# Patient Record
Sex: Female | Born: 1985 | Race: White | Hispanic: No | Marital: Single | Smoking: Never smoker
Health system: Southern US, Community
[De-identification: ages and names within clinical notes are randomized; demographics above are authoritative.]

## PROBLEM LIST (undated history)

## (undated) ENCOUNTER — Inpatient Hospital Stay (HOSPITAL_COMMUNITY): Payer: Self-pay

## (undated) DIAGNOSIS — F419 Anxiety disorder, unspecified: Secondary | ICD-10-CM

## (undated) DIAGNOSIS — F32A Depression, unspecified: Secondary | ICD-10-CM

## (undated) DIAGNOSIS — F329 Major depressive disorder, single episode, unspecified: Secondary | ICD-10-CM

## (undated) DIAGNOSIS — F909 Attention-deficit hyperactivity disorder, unspecified type: Secondary | ICD-10-CM

## (undated) DIAGNOSIS — O139 Gestational [pregnancy-induced] hypertension without significant proteinuria, unspecified trimester: Secondary | ICD-10-CM

## (undated) DIAGNOSIS — J45909 Unspecified asthma, uncomplicated: Secondary | ICD-10-CM

## (undated) DIAGNOSIS — J302 Other seasonal allergic rhinitis: Secondary | ICD-10-CM

## (undated) HISTORY — DX: Attention-deficit hyperactivity disorder, unspecified type: F90.9

## (undated) HISTORY — DX: Other seasonal allergic rhinitis: J30.2

## (undated) HISTORY — DX: Anxiety disorder, unspecified: F41.9

## (undated) HISTORY — DX: Depression, unspecified: F32.A

## (undated) HISTORY — PX: NO PAST SURGERIES: SHX2092

## (undated) HISTORY — DX: Unspecified asthma, uncomplicated: J45.909

## (undated) HISTORY — DX: Major depressive disorder, single episode, unspecified: F32.9

---

## 2010-07-06 ENCOUNTER — Encounter: Payer: Self-pay | Admitting: Family Medicine

## 2010-07-06 NOTE — Progress Notes (Signed)
Subjective:    Patient ID: Melissa Young, female    DOB: 03-12-1986, 25 y.o.   MRN: [id number]  Sinusitis This is a new problem. The current episode started 1 to 4 weeks ago. The problem is unchanged. There has been no fever. The pain is mild. Associated symptoms include congestion, coughing, ear pain, sinus pressure and sneezing. Pertinent negatives include no chills, diaphoresis, headaches, hoarse voice, neck pain, shortness of breath, sore throat or swollen glands. Treatments tried: zyrtec 10 mg daily. The treatment provided no relief.   Past Medical History  Diagnosis Date  . Seasonal allergies    No past surgical history on file. History   Social History  . Marital Status: N/A    Spouse Name: N/A    Number of Children: N/A  . Years of Education: N/A   Occupational History  . Not on file.   Social History Main Topics  . Smoking status: Never Smoker   . Smokeless tobacco: Not on file  . Alcohol Use: No  . Drug Use: No  . Sexually Active: Not on file   Other Topics Concern  . Not on file   Social History Narrative  . No narrative on file   No family history on file. No current outpatient prescriptions on file prior to visit.   No Known Allergies Immunization History  Administered Date(s) Administered  . Td 04/03/1999   Prior to Admission medications   Medication Sig Start Date End Date Taking? Authorizing Provider  amoxicillin (AMOXIL) 875 MG tablet Take 875 mg by mouth 2 (two) times daily.   07/06/10 07/16/10 Yes Historical Provider, MD  cetirizine (ZYRTEC) 10 MG tablet Take 10 mg by mouth daily.     Yes Historical Provider, MD  drospirenone-ethinyl estradiol (YASMIN) 3-0.03 MG per tablet Take 1 tablet by mouth daily.     Yes Historical Provider, MD  fluticasone (FLONASE) 50 MCG/ACT nasal spray 2 sprays by Nasal route daily. #1 RFx2  07/06/10  Yes Historical Provider, MD  @   Review of Systems  Constitutional: Negative.  Negative for chills and diaphoresis.  HENT:  Positive for ear pain, congestion, rhinorrhea, sneezing, postnasal drip and sinus pressure. Negative for hearing loss, nosebleeds, sore throat, hoarse voice, facial swelling, neck pain, neck stiffness, tinnitus and ear discharge.   Eyes: Negative.   Respiratory: Positive for cough. Negative for apnea, choking, chest tightness, shortness of breath, wheezing and stridor.   Cardiovascular: Negative.   Gastrointestinal: Negative.   Musculoskeletal: Negative.   Neurological: Negative.  Negative for headaches.  Hematological: Negative.   Psychiatric/Behavioral: Negative.      BP 106/70  Pulse 66  Temp 98.9 F (37.2 C)  Resp 14  Wt 210 lb (95.255 kg)    Objective:   Physical Exam  Constitutional: She is oriented to person, place, and time. She appears well-developed and well-nourished. No distress.  HENT:  Head: Normocephalic and atraumatic.  Right Ear: Hearing, external ear and ear canal normal. Tympanic membrane is injected.  Left Ear: Hearing, tympanic membrane, external ear and ear canal normal.  Nose: Mucosal edema and rhinorrhea present. No nose lacerations, sinus tenderness, nasal deformity, septal deviation or nasal septal hematoma. No epistaxis.  No foreign bodies. Right sinus exhibits maxillary sinus tenderness. Right sinus exhibits no frontal sinus tenderness. Left sinus exhibits no maxillary sinus tenderness and no frontal sinus tenderness.  Mouth/Throat: Uvula is midline, oropharynx is clear and moist and mucous membranes are normal.  Eyes: Conjunctivae and EOM are normal. Pupils are equal,  round, and reactive to light.  Neck: Normal range of motion. Neck supple.  Cardiovascular: Normal rate, regular rhythm, normal heart sounds and intact distal pulses.   No murmur heard. Pulmonary/Chest: Effort normal and breath sounds normal.  Abdominal: Soft. Bowel sounds are normal.  Musculoskeletal: Normal range of motion.  Neurological: She is alert and oriented to person, place, and  time. She has normal reflexes.  Skin: Skin is warm and dry. She is not diaphoretic.  Psychiatric: She has a normal mood and affect. Her behavior is normal. Judgment and thought content normal.          Assessment & Plan:  1. Seasonal allergic rhinitis 2.Right Maxillary sinusitis 3.Otalgia, right  Plan: Stay on the zyrtec Added Flonase as above.#1 RFx2, 2 sprays per nostril daily. Amoxil 875 mg po twice daily x 10 days. Fluids Allergen avoidance.  RTC prn and for her physical as p[lanned in a couple of months.

## 2011-04-09 ENCOUNTER — Encounter: Payer: Self-pay | Admitting: Obstetrics & Gynecology

## 2011-04-09 ENCOUNTER — Ambulatory Visit (INDEPENDENT_AMBULATORY_CARE_PROVIDER_SITE_OTHER): Payer: Private Health Insurance - Indemnity | Admitting: Obstetrics & Gynecology

## 2011-04-09 VITALS — BP 137/80 | HR 67 | Ht 64.0 in | Wt 238.4 lb

## 2011-04-09 DIAGNOSIS — Z309 Encounter for contraceptive management, unspecified: Secondary | ICD-10-CM

## 2011-04-09 DIAGNOSIS — Z30017 Encounter for initial prescription of implantable subdermal contraceptive: Secondary | ICD-10-CM

## 2011-04-09 NOTE — Progress Notes (Signed)
  Subjective:    Patient ID: Melissa Young, female    DOB: 1986/02/17, 26 y.o.   MRN: [id number]  HPI  She is here because she finds the OCPs difficult to remember. Several of her friends have Implanon and are happy with it. She wants to have it in spite of my cautions about irregular bleeding. She tells me that she used depo provera in the past so she is familiar with irregular bleeding. Review of Systems    Her last pap was in June and was normal Objective:   Physical Exam  I prepped her left arm about 8 cm about the medial epicondyle. It was then infiltrated with 3 cc of 1% lidocaine. I placed the Implanon close to the skin and wrapped her arm with compression tape.      Assessment & Plan:  Birth control- Implanon now in place. RTC for annual in June.

## 2011-05-07 ENCOUNTER — Ambulatory Visit (INDEPENDENT_AMBULATORY_CARE_PROVIDER_SITE_OTHER): Payer: Private Health Insurance - Indemnity | Admitting: Obstetrics & Gynecology

## 2011-05-07 ENCOUNTER — Encounter: Payer: Self-pay | Admitting: Obstetrics & Gynecology

## 2011-05-07 VITALS — BP 138/89 | HR 89 | Ht 65.0 in | Wt 240.0 lb

## 2011-05-07 DIAGNOSIS — Z3040 Encounter for surveillance of contraceptives, unspecified: Secondary | ICD-10-CM

## 2011-05-07 NOTE — Progress Notes (Signed)
History:  26 y.o. here for Implanon check.  No concerns.  No  Irregular bleeding.  The following portions of the patient's history were reviewed and updated as appropriate: allergies, current medications, past family history, past medical history, past social history, past surgical history and problem list.   Objective:  Physical Exam Blood pressure 138/89, pulse 89, height 5\' 5"  (1.651 m), weight 240 lb (108.863 kg), last menstrual period 03/12/2011. Gen: NAD Left arm: Implanon palpated.  No erythema, tenderness or other concerns.   Assessment & Plan:  Stable Implanon check Counseled regarding possible side-effects Return to clinic for annual exam in the summer

## 2011-05-07 NOTE — Patient Instructions (Signed)

## 2011-09-18 ENCOUNTER — Encounter: Payer: Self-pay | Admitting: Obstetrics and Gynecology

## 2011-09-18 ENCOUNTER — Ambulatory Visit (INDEPENDENT_AMBULATORY_CARE_PROVIDER_SITE_OTHER): Payer: Private Health Insurance - Indemnity | Admitting: Obstetrics and Gynecology

## 2011-09-18 VITALS — BP 122/87 | HR 82 | Ht 65.0 in | Wt 252.0 lb

## 2011-09-18 DIAGNOSIS — Z3046 Encounter for surveillance of implantable subdermal contraceptive: Secondary | ICD-10-CM

## 2011-09-18 MED ORDER — NORETHIN-ETH ESTRAD-FE BIPHAS 1 MG-10 MCG / 10 MCG PO TABS: 1.0000 | ORAL_TABLET | Freq: Every day | ORAL | Status: DC

## 2011-09-18 NOTE — Progress Notes (Signed)
26 yo presenting today requesting Implanon removal. Patient had Implanon placed in 04/2011 and reports dissatisfaction with irregular bleeding and worsening acne. Patient planning on using OCP for birth control. Rx for lo loestrin provided  Implanon removal Patient given informed consent for removal of her Implanon, time out was performed.  Signed copy in the chart.  Appropriate time out taken. Implanon site identified.  Area prepped in usual sterile fashon. One cc of 1% lidocaine was used to anesthetize the area at the distal end of the implant. A small stab incision was made right beside the implant on the distal portion.  The implanon rod was grasped using hemostats and removed without difficulty.  There was less than 3 cc blood loss. There were no complications.  A small amount of antibiotic ointment and steri-strips were applied over the small incision.  A pressure bandage was applied to reduce any bruising.  The patient tolerated the procedure well and was given post procedure instructions.

## 2012-10-09 LAB — HM PAP SMEAR: HM Pap smear: NORMAL

## 2013-11-17 LAB — HEMOGLOBIN A1C: HEMOGLOBIN A1C: 5.4 % (ref 4.0–6.0)

## 2014-10-11 ENCOUNTER — Encounter: Payer: Self-pay | Admitting: Physician Assistant

## 2014-10-11 ENCOUNTER — Ambulatory Visit (INDEPENDENT_AMBULATORY_CARE_PROVIDER_SITE_OTHER): Payer: BLUE CROSS/BLUE SHIELD | Admitting: Physician Assistant

## 2014-10-11 VITALS — BP 122/80 | HR 78 | Temp 98.4°F | Resp 14 | Ht 65.0 in | Wt 257.0 lb

## 2014-10-11 DIAGNOSIS — N912 Amenorrhea, unspecified: Secondary | ICD-10-CM

## 2014-10-11 DIAGNOSIS — Z139 Encounter for screening, unspecified: Secondary | ICD-10-CM

## 2014-10-11 LAB — PREGNANCY, URINE: PREG TEST UR: NEGATIVE

## 2014-10-11 NOTE — Progress Notes (Signed)
Patient ID: Melissa Young MRN: [id number], DOB: 08/10/1985, 29 y.o. Date of Encounter: 10/11/2014, 5:16 PM    Chief Complaint:  Chief Complaint  Patient presents with  . irregular Menses    [facility]- has not had regular menses- has had negative home pregnancy tests- would like blood test     HPI: 29 y.o. year old white female presents with above.   Says [place]  [place] [facility] [place] then she will return here to have CPE.   Says she was on OCT until March.  Says she isn't "trying" to get pregnant, but says she would be ok with getting pregnant. Says this is why OCT was stopped.   After she stopped OCT, had some menstrual bleeding in beginning of April.  In May saw "light spotting" once--when urinated saw pink one time.  Has had no bleeding, spotting at all since then.   Has done multiple pregnancy tests at home--all negative.   Wasn't sure if needed serum pregnancy test.       Home Meds:   Outpatient Prescriptions Prior to Visit  Medication Sig Dispense Refill  . cetirizine (ZYRTEC) 10 MG tablet Take 10 mg by mouth daily.      . fluticasone (FLONASE) 50 MCG/ACT nasal spray 2 sprays by Nasal route daily. #1 RFx2     . drospirenone-ethinyl estradiol (YASMIN) 3-0.03 MG per tablet Take 1 tablet by mouth daily.      . Norethindrone-Ethinyl Estradiol-Fe Biphas (LO LOESTRIN FE) 1 MG-10 MCG / 10 MCG tablet Take 1 tablet by mouth daily. 1 Package 11  . norgestimate-ethinyl estradiol (ORTHO-CYCLEN,SPRINTEC,PREVIFEM) 0.25-35 MG-MCG tablet Take 1 tablet by mouth daily.      . sertraline (ZOLOFT) 50 MG tablet Take 50 mg by mouth daily.       No facility-administered medications prior to visit.    Allergies: No Known Allergies    Review of Systems: See HPI for pertinent ROS. All other ROS negative.    Physical Exam: Blood pressure 122/80, pulse 78, temperature 98.4 F (36.9 C),  temperature source Oral, resp. rate 14, height 5\' 5"  (1.651 m), weight 257 lb (116.574 kg), last menstrual period 07/05/2014., Body mass index is 42.77 kg/(m^2). General:  Obese WF. Appears in no acute distress. Neck: Supple. No thyromegaly. No lymphadenopathy. Lungs: Clear bilaterally to auscultation without wheezes, rales, or rhonchi. Breathing is unlabored. Heart: Regular rhythm. No murmurs, rubs, or gallops. Msk:  Strength and tone normal for age. Extremities/Skin: Warm and dry.  Neuro: Alert and oriented X 3. Moves all extremities spontaneously. Gait is normal. CNII-XII grossly in tact. Psych:  Responds to questions appropriately with a normal affect.   Results for orders placed or performed in visit on 10/11/14  Pregnancy, urine  Result Value Ref Range   Preg Test, Ur NEG      ASSESSMENT AND PLAN:  29 y.o. year old female with  1. Amenorrhea I explained serum pregnancy tests vs urine pregnancy tests.  Pt then saw no reason to do serum pregnancy test.  Discussed doing some lab work, eval for amenorrhea.  She says will schedule another OV to f/u [place].  In interim discussed getting plenty of sleep, good diet, and avoid stressors. (She brings up stress related to move etc.)  2. Screening - Pregnancy, urine    Signed, [address] [place], [place], [facility] 10/11/2014 5:16 PM

## 2014-10-21 ENCOUNTER — Other Ambulatory Visit: Payer: BLUE CROSS/BLUE SHIELD

## 2014-10-21 DIAGNOSIS — N912 Amenorrhea, unspecified: Secondary | ICD-10-CM

## 2014-10-21 DIAGNOSIS — Z0000 Encounter for general adult medical examination without abnormal findings: Secondary | ICD-10-CM

## 2014-10-21 LAB — CBC WITH DIFFERENTIAL/PLATELET
BASOS PCT: 0 % (ref 0–1)
Basophils Absolute: 0 10*3/uL (ref 0.0–0.1)
Eosinophils Absolute: 0.4 10*3/uL (ref 0.0–0.7)
Eosinophils Relative: 7 % — ABNORMAL HIGH (ref 0–5)
HEMATOCRIT: 43.1 % (ref 36.0–46.0)
HEMOGLOBIN: 14.3 g/dL (ref 12.0–15.0)
LYMPHS PCT: 38 % (ref 12–46)
Lymphs Abs: 2.2 10*3/uL (ref 0.7–4.0)
MCH: 30.3 pg (ref 26.0–34.0)
MCHC: 33.2 g/dL (ref 30.0–36.0)
MCV: 91.3 fL (ref 78.0–100.0)
MPV: 9.8 fL (ref 8.6–12.4)
Monocytes Absolute: 0.7 10*3/uL (ref 0.1–1.0)
Monocytes Relative: 12 % (ref 3–12)
NEUTROS PCT: 43 % (ref 43–77)
Neutro Abs: 2.5 10*3/uL (ref 1.7–7.7)
Platelets: 332 10*3/uL (ref 150–400)
RBC: 4.72 MIL/uL (ref 3.87–5.11)
RDW: 13.3 % (ref 11.5–15.5)
WBC: 5.8 10*3/uL (ref 4.0–10.5)

## 2014-10-21 LAB — LIPID PANEL
CHOL/HDL RATIO: 3.7 ratio
Cholesterol: 190 mg/dL (ref 0–200)
HDL: 52 mg/dL (ref >=46)
LDL CALC: 120 mg/dL — AB (ref 0–99)
TRIGLYCERIDES: 88 mg/dL (ref <150)
VLDL: 18 mg/dL (ref 0–40)

## 2014-10-21 LAB — COMPLETE METABOLIC PANEL WITH GFR
ALBUMIN: 3.8 g/dL (ref 3.5–5.2)
ALK PHOS: 72 U/L (ref 39–117)
ALT: 24 U/L (ref 0–35)
AST: 27 U/L (ref 0–37)
BILIRUBIN TOTAL: 0.6 mg/dL (ref 0.2–1.2)
BUN: 12 mg/dL (ref 6–23)
CO2: 22 mEq/L (ref 19–32)
CREATININE: 0.65 mg/dL (ref 0.50–1.10)
Calcium: 9.4 mg/dL (ref 8.4–10.5)
Chloride: 103 mEq/L (ref 96–112)
GFR, Est African American: >89 mL/min
GFR, Est Non African American: >89 mL/min
GLUCOSE: 77 mg/dL (ref 70–99)
Potassium: 4.2 mEq/L (ref 3.5–5.3)
SODIUM: 138 meq/L (ref 135–145)
TOTAL PROTEIN: 6.9 g/dL (ref 6.0–8.3)

## 2014-10-21 LAB — TSH: TSH: 3.788 u[IU]/mL (ref 0.350–4.500)

## 2014-10-22 LAB — HCG, QUANTITATIVE, PREGNANCY

## 2014-10-27 ENCOUNTER — Encounter: Payer: Self-pay | Admitting: Physician Assistant

## 2014-10-27 ENCOUNTER — Ambulatory Visit (INDEPENDENT_AMBULATORY_CARE_PROVIDER_SITE_OTHER): Payer: BLUE CROSS/BLUE SHIELD | Admitting: Physician Assistant

## 2014-10-27 VITALS — BP 122/78 | HR 68 | Temp 98.0°F | Resp 18 | Wt 258.0 lb

## 2014-10-27 DIAGNOSIS — N912 Amenorrhea, unspecified: Secondary | ICD-10-CM

## 2014-10-27 DIAGNOSIS — Z0000 Encounter for general adult medical examination without abnormal findings: Secondary | ICD-10-CM | POA: Diagnosis not present

## 2014-10-27 DIAGNOSIS — E669 Obesity, unspecified: Secondary | ICD-10-CM | POA: Diagnosis not present

## 2014-10-27 DIAGNOSIS — J302 Other seasonal allergic rhinitis: Secondary | ICD-10-CM | POA: Insufficient documentation

## 2014-10-27 NOTE — Progress Notes (Signed)
Patient ID: Melissa Young MRN: [id number], DOB: [birth year], 29 y.o. Date of Encounter: 10/27/2014,   Chief Complaint: Physical (CPE)  HPI: 29 y.o. y/o white female  here for CPE.   She states that she had been on oral contraception therapy for > 10 years. Went off of them in March. Has not had a regular menses since then. Recently came in and had pregnancy test which was negative.  She recently moved here from [place]. Says multiple family members come here to this office.  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 Says in the last couple days she started to get a little bit of a scratchy throat and feels some congestion in her chest. Thinks it's just a cold.   Says that her prior doctor prescribed Vyvanse for what he called Binge Eating Disorder.  Says that she recently had stopped the Vyvanse and went on vacation and has gained about 20 pounds.  She has seasonal allergies and during those times uses Zyrtec and Flonase.  She reports that she has no other known past medical history. Has never had surgeries. Has no significant family medical history.  Review of Systems: Consitutional: No fever, chills, fatigue, night sweats, lymphadenopathy. No significant/unexplained weight changes. Eyes: No visual changes, eye redness, or discharge. ENT/Mouth: No ear pain,  nasal drainage, or sinus pain. Cardiovascular: No chest pressure,heaviness, tightness or squeezing, even with exertion. No increased shortness of breath or dyspnea on exertion.No palpitations, edema, orthopnea, PND. Respiratory: No cough, hemoptysis, SOB, or wheezing. Gastrointestinal: No anorexia, dysphagia, reflux, pain, nausea, vomiting, hematemesis, diarrhea, constipation, BRBPR, or melena. Breast: No mass, nodules, bulging, or retraction. No skin changes or inflammation. No nipple discharge. No lymphadenopathy. Genitourinary: No dysuria, hematuria, incontinence, vaginal discharge, pruritis,  burning, abnormal bleeding, or pain. Musculoskeletal: No decreased ROM, No joint pain or swelling. No significant pain in neck, back, or extremities. Skin: No rash, pruritis, or concerning lesions. Neurological: No headache, dizziness, syncope, seizures, tremors, memory loss, coordination problems, or paresthesias. Psychological: No anxiety, depression, hallucinations, SI/HI. Endocrine: No polydipsia, polyphagia, polyuria, or known diabetes.No increased fatigue. No palpitations/rapid heart rate. No significant/unexplained weight change. All other systems were reviewed and are otherwise negative.  Past Medical History  Diagnosis Date  . Seasonal allergies   . Anxiety   . Depression   . Morbid obesity      No past surgical history on file.  Home Meds:  Outpatient Prescriptions Prior to Visit  Medication Sig Dispense Refill  . cetirizine (ZYRTEC) 10 MG tablet Take 10 mg by mouth daily.      . fluticasone (FLONASE) 50 MCG/ACT nasal spray 2 sprays by Nasal route daily. #1 RFx2      No facility-administered medications prior to visit.    Allergies: No Known Allergies  History   Social History  . Marital Status: Single    Spouse Name: N/A  . Number of Children: N/A  . Years of Education: N/A   Occupational History  . Not on file.   Social History Main Topics  . Smoking status: Never Smoker   . Smokeless tobacco: Not on file  . Alcohol Use: Yes     Comment: occasionally  . Drug Use: No  . Sexual Activity: Yes    Birth Control/ Protection: Pill   Other Topics Concern  . Not on file   Social History Narrative    Family History  Problem Relation Age of Onset  . Alcohol abuse Maternal Grandmother  Physical Exam: Blood pressure 122/78, pulse 68, temperature 98 F (36.7 C), temperature source Oral, resp. rate 18, weight 258 lb (117.028 kg), last menstrual period 07/05/2014., Body mass index is 42.93 kg/(m^2). General: Obese WF. Appears in no acute distress. HEENT:  Normocephalic, atraumatic. Conjunctiva pink, sclera non-icteric. Pupils 2 mm constricting to 1 mm, round, regular, and equally reactive to light and accomodation. EOMI. Internal auditory canal clear. TMs with good cone of light and without pathology. Nasal mucosa pink. Nares are without discharge. No sinus tenderness. Oral mucosa pink.  Pharynx without exudate.   Neck: Supple. Trachea midline. No thyromegaly. Full ROM. No lymphadenopathy.No Carotid Bruits. Lungs: Clear to auscultation bilaterally without wheezes, rales, or rhonchi. Breathing is of normal effort and unlabored. Cardiovascular: RRR with S1 S2. No murmurs, rubs, or gallops. Distal pulses 2+ symmetrically. No carotid or abdominal bruits. Breast: Symmetrical. No masses. Nipples without discharge. Abdomen: Soft, non-tender, non-distended with normoactive bowel sounds. No hepatosplenomegaly or masses. No rebound/guarding. No CVA tenderness. No hernias.  Genitourinary:  External genitalia without lesions. Vaginal mucosa pink.No discharge present. Cervix pink and without discharge. No cervical tenderness.Normal uterus size. No adnexal mass or tenderness.  Pap smear taken. Musculoskeletal: Full range of motion and 5/5 strength throughout. Without swelling, atrophy, tenderness, crepitus, or warmth. Skin: Warm and moist without erythema, ecchymosis, wounds, or rash. Neuro: A+Ox3. CN II-XII grossly intact. Moves all extremities spontaneously. Full sensation throughout. Normal gait. DTR 2+ throughout upper and lower extremities.  Psych:  Responds to questions appropriately with a normal affect.   Assessment/Plan:  29 y.o. y/o female here for CPE 1. Visit for preventive health examination  A. Screening Labs: She had fasting labs 10/21/14. CME T normal TSH normal FLP normal CBC normal  B. Pap: Pelvic exam normal today. Will send Pap smear. - PAP, ThinPrep ASCUS Rflx HPV Rflx Type  C. Screening Mammogram: No indication to start this prior  to age 66  D. DEXA/BMD:  Will wait until closer to age 3 to start this  E. Colorectal Cancer Screening: No indication to require this until age 29  F. Immunizations:  Influenza:---------N/A--July Tetanus:--------- she reports that she had this with her provider in [facility] for 5 years ago. Records pending. Pneumococcal:--- No indication to need this until age 2 Zostavax:------------ not indicated until age 54      2. Amenorrhea following discontinuation of oral contraceptive use - Ambulatory referral to Gynecology Will refer to gynecology for further evaluation.   3. Obesity - Ambulatory referral to Gynecology Given her obesity, amenorrhea--could possibly have PCOS--Reger to Gyn for eval.  Her prior provider prescribed Vyvanse for binge eating disorder. I do not prescribe this medication for this indication. Patient did not ask for prescription for this today.  4. Seasonal allergies She uses Zyrtec and Flonase when needed and this controls symptoms.  5. Viral URI If symptoms worsen significantly or persist greater than 7 days call me.  Murray Hodgkins [place], [place], [facility] 10/27/2014 8:32 AM

## 2014-10-28 ENCOUNTER — Telehealth: Payer: Self-pay | Admitting: Family Medicine

## 2014-10-28 MED ORDER — AZITHROMYCIN 250 MG PO TABS: ORAL_TABLET | ORAL | Status: DC

## 2014-10-28 NOTE — Telephone Encounter (Signed)
Pt made aware of provider recommendations and rx to pharmacy

## 2014-10-28 NOTE — Telephone Encounter (Signed)
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  Said she had scratchy throat for couple days.  Tell her that most of these illnesses are caused by viruses, which means they will run their course and resolve wihtout treatment---but usually persists about 7 days.  Tell her if it is a virus, antibiotics will do nothing.  And, in that case, she is building up antibiotic resistnce.  Tell her to only take antibiotic if symptoms worsen a lot or last > 7 days.  If she starts abx, complete all of it.  Azithromycin   Day 1: Take 2 daily.  Days 2-5: Take 1 daily.  # 6 (one pack) + 0.

## 2014-10-28 NOTE — Telephone Encounter (Signed)
Pt seen yesterday.  Says her cough is much worse, some chills, chest very tight and painful.  Can you call something in for her?

## 2014-10-29 LAB — PAP THINPREP ASCUS RFLX HPV RFLX TYPE

## 2014-11-01 ENCOUNTER — Encounter: Payer: Self-pay | Admitting: Family Medicine

## 2014-11-01 ENCOUNTER — Encounter: Payer: Self-pay | Admitting: Physician Assistant

## 2014-11-03 MED ORDER — FLUCONAZOLE 150 MG PO TABS: 150.0000 mg | ORAL_TABLET | Freq: Once | ORAL | Status: DC

## 2014-11-09 ENCOUNTER — Ambulatory Visit (INDEPENDENT_AMBULATORY_CARE_PROVIDER_SITE_OTHER): Payer: BLUE CROSS/BLUE SHIELD | Admitting: Women's Health

## 2014-11-09 ENCOUNTER — Encounter: Payer: Self-pay | Admitting: Women's Health

## 2014-11-09 VITALS — BP 128/80 | Ht 65.0 in | Wt 250.0 lb

## 2014-11-09 DIAGNOSIS — N912 Amenorrhea, unspecified: Secondary | ICD-10-CM | POA: Diagnosis not present

## 2014-11-09 NOTE — Progress Notes (Signed)
Melissa Young [birth year] [id number]    History:    Presents for new patient with a problem of irregular cycles/amenorrhea. Stopped OCs February 2016 and has not had a cycle since with numerous negative UPTs. Had been on OCs for about 10 years. Prior to OCs had monthly cycles. Increased facial/chin hair growth, acne and weight gain last 6 months. Binge eating disorder had been on Vyvanse prior to moving here from [place]. Normal Pap this year, TSH 3.7. Completed gardasil. Same partner with negative STD screen. No menopausal symptoms. Annual exam with primary care June 2016.  Past medical history, past surgical history, family history and social history were all reviewed and documented in the EPIC chart. 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  ROS:  A ROS was performed and pertinent positives and negatives are included.  Exam:  Filed Vitals:   11/09/14 1502  BP: 128/80    General appearance: Obese  Thyroid:  Symmetrical, normal in size, without palpable masses or nodularity. Respiratory  Auscultation:  Clear without wheezing or rhonchi Cardiovascular  Auscultation:  Regular rate, without rubs, murmurs or gallops  Edema/varicosities:  Not grossly evident Abdominal  Soft,nontender, without masses, guarding or rebound.  Liver/spleen:  No organomegaly noted  Hernia:  None appreciated  Skin  Inspection:  Grossly normal   Breasts: Examined lying and sitting.     Right: Without masses, retractions, discharge or axillary adenopathy.     Left: Without masses, retractions, discharge or axillary adenopathy. Gentitourinary   Inguinal/mons:  Normal without inguinal adenopathy  External genitalia:  Normal  BUS/Urethra/Skene's glands:  Normal  Vagina:  Normal  Cervix:  Normal  Uterus:  normal in size, shape and contour.  Midline and mobile  Adnexa/parametria:     Rt: Without masses or tenderness.   Lt: Without masses or  tenderness.  Anus and perineum: Normal  Digital rectal exam: Normal sphincter tone without palpated masses or tenderness  Assessment/Plan:  28 y.o.SWF G0  for problem/amenorrhea.  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 Not desiring conception at this time, after ultrasound reviewed starting back on OCs until desiring pregnancy. Reviewed importance of decreasing calories and increasing exercise for weight loss, Weight Watchers encouraged. Encouraged to return to primary care for binge eating disorder.      Harrington Challenger [facility], 4:32 PM 11/09/2014

## 2014-11-09 NOTE — Patient Instructions (Signed)
Polycystic Ovarian Syndrome  Polycystic ovarian syndrome (PCOS) is a common hormonal disorder among women of reproductive age. Most women with PCOS grow many small cysts on their ovaries. PCOS can cause problems with your periods and make it difficult to get pregnant. It can also cause an increased risk of miscarriage with pregnancy. If left untreated, PCOS can lead to serious health problems, such as diabetes and heart disease.  CAUSES  The cause of PCOS is not fully understood, but genetics may be a factor.  SIGNS AND SYMPTOMS    Infrequent or no menstrual periods.    Inability to get pregnant (infertility) because of not ovulating.    Increased growth of hair on the face, chest, stomach, back, thumbs, thighs, or toes.    Acne, oily skin, or dandruff.    Pelvic pain.    Weight gain or obesity, usually carrying extra weight around the waist.    Type 2 diabetes.    High cholesterol.    High blood pressure.    Female-pattern baldness or thinning hair.    Patches of thickened and dark brown or black skin on the neck, arms, breasts, or thighs.    Tiny excess flaps of skin (skin tags) in the armpits or neck area.    Excessive snoring and having breathing stop at times while asleep (sleep apnea).    Deepening of the voice.    Gestational diabetes when pregnant.   DIAGNOSIS   There is no single test to diagnose PCOS.    Your health care provider will:    Take a medical history.    Perform a pelvic exam.    Have ultrasonography done.    Check your female and female hormone levels.    Measure glucose or sugar levels in the blood.    Do other blood tests.    If you are producing too many female hormones, your health care provider will make sure it is from PCOS. At the physical exam, your health care provider will want to evaluate the areas of increased hair growth. Try to allow natural hair growth for a few days before the visit.    During a pelvic exam, the ovaries may be  enlarged or swollen because of the increased number of small cysts. This can be seen more easily by using vaginal ultrasonography or screening to examine the ovaries and lining of the uterus (endometrium) for cysts. The uterine lining may become thicker if you have not been having a regular period.   TREATMENT   Because there is no cure for PCOS, it needs to be managed to prevent problems. Treatments are based on your symptoms. Treatment is also based on whether you want to have a baby or whether you need contraception.   Treatment may include:    Progesterone hormone to start a menstrual period.    Birth control pills to make you have regular menstrual periods.    Medicines to make you ovulate, if you want to get pregnant.    Medicines to control your insulin.    Medicine to control your blood pressure.    Medicine and diet to control your high cholesterol and triglycerides in your blood.   Medicine to reduce excessive hair growth.   Surgery, making small holes in the ovary, to decrease the amount of female hormone production. This is done through a long, lighted tube (laparoscope) placed into the pelvis through a tiny incision in the lower abdomen.   HOME CARE INSTRUCTIONS   Only   take over-the-counter or prescription medicine as directed by your health care provider.   Pay attention to the foods you eat and your activity levels. This can help reduce the effects of PCOS.   Keep your weight under control.   Eat foods that are low in carbohydrate and high in fiber.   Exercise regularly.  SEEK MEDICAL CARE IF:   Your symptoms do not get better with medicine.   You have new symptoms.  Document Released: 07/13/2004 Document Revised: 01/07/2013 Document Reviewed: 09/04/2012  [employer] Patient Information 2015 [employer]. This information is not intended to replace advice given to you by your health care provider. Make sure you discuss any questions you have with your health care provider.

## 2014-11-10 ENCOUNTER — Other Ambulatory Visit: Payer: Self-pay | Admitting: Women's Health

## 2014-11-10 ENCOUNTER — Encounter: Payer: Self-pay | Admitting: Physician Assistant

## 2014-11-10 ENCOUNTER — Encounter: Payer: Self-pay | Admitting: Women's Health

## 2014-11-10 ENCOUNTER — Encounter: Payer: Self-pay | Admitting: Family Medicine

## 2014-11-10 DIAGNOSIS — B029 Zoster without complications: Secondary | ICD-10-CM | POA: Insufficient documentation

## 2014-11-10 DIAGNOSIS — E782 Mixed hyperlipidemia: Secondary | ICD-10-CM

## 2014-11-10 DIAGNOSIS — F909 Attention-deficit hyperactivity disorder, unspecified type: Secondary | ICD-10-CM

## 2014-11-10 DIAGNOSIS — F5081 Binge eating disorder: Secondary | ICD-10-CM | POA: Insufficient documentation

## 2014-11-10 DIAGNOSIS — F50819 Binge eating disorder, unspecified: Secondary | ICD-10-CM | POA: Insufficient documentation

## 2014-11-10 DIAGNOSIS — K219 Gastro-esophageal reflux disease without esophagitis: Secondary | ICD-10-CM | POA: Insufficient documentation

## 2014-11-10 DIAGNOSIS — R768 Other specified abnormal immunological findings in serum: Secondary | ICD-10-CM

## 2014-11-10 LAB — PROLACTIN: PROLACTIN: 24.4 ng/mL

## 2014-11-10 LAB — TESTOSTERONE, FREE, TOTAL, SHBG
Sex Hormone Binding: 47 nmol/L (ref 17–124)
TESTOSTERONE: 122 ng/dL — AB (ref 10–70)
Testosterone, Free: 18 pg/mL — ABNORMAL HIGH (ref 0.6–6.8)
Testosterone-% Free: 1.5 % (ref 0.4–2.4)

## 2014-11-10 MED ORDER — DROSPIRENONE-ETHINYL ESTRADIOL 3-0.02 MG PO TABS: 1.0000 | ORAL_TABLET | Freq: Every day | ORAL | Status: DC

## 2014-11-19 ENCOUNTER — Ambulatory Visit (INDEPENDENT_AMBULATORY_CARE_PROVIDER_SITE_OTHER): Payer: BLUE CROSS/BLUE SHIELD | Admitting: Women's Health

## 2014-11-19 ENCOUNTER — Encounter: Payer: Self-pay | Admitting: Women's Health

## 2014-11-19 ENCOUNTER — Ambulatory Visit (INDEPENDENT_AMBULATORY_CARE_PROVIDER_SITE_OTHER): Payer: BLUE CROSS/BLUE SHIELD

## 2014-11-19 ENCOUNTER — Other Ambulatory Visit: Payer: Self-pay | Admitting: Women's Health

## 2014-11-19 DIAGNOSIS — E282 Polycystic ovarian syndrome: Secondary | ICD-10-CM | POA: Diagnosis not present

## 2014-11-19 DIAGNOSIS — N8320 Unspecified ovarian cysts: Secondary | ICD-10-CM

## 2014-11-19 DIAGNOSIS — N912 Amenorrhea, unspecified: Secondary | ICD-10-CM

## 2014-11-19 DIAGNOSIS — N83201 Unspecified ovarian cyst, right side: Secondary | ICD-10-CM

## 2014-11-19 NOTE — Progress Notes (Signed)
Patient ID: Melissa Young, female   DOB: Oct 13, 1985, 29 y.o.   MRN: [id number] Presents for ultrasound. At annual exam suspicious for possible PCO S.  Has had periods of amenorrhea when not on contraception. Had been on Yaz but stopped and had amenorrhea. Has had problems with weight gain, mild hirsutism and did not conceive when off OCs. Same partner.  Exam: Obese. Ultrasound: T/V anteverted uterus homogeneous echo pattern. Endometrium 6.3 mm. Right and left ovary increased volume greater than 10 mL. Numerous follicles permenter of ovaries greater than 10. Positive arterial blood flow bilateral ovaries. Right ovary thin-walled cyst 20 x 12 x 14 mm with solid anterior focus 4 x 2 x 3 mm questionable dermoid plug, negative CFD. Negative cul-de-sac .  PCO S  Plan: Reviewed ultrasound findings. Repeat ultrasound in 3 months to check cyst with questionable dermoid plug. Continue Yaz call if no withdrawal bleed after completing 2 months. Reviewed importance of regular exercise, cutting  calories for weight loss.

## 2014-11-19 NOTE — Patient Instructions (Signed)
Polycystic Ovarian Syndrome  Polycystic ovarian syndrome (PCOS) is a common hormonal disorder among women of reproductive age. Most women with PCOS grow many small cysts on their ovaries. PCOS can cause problems with your periods and make it difficult to get pregnant. It can also cause an increased risk of miscarriage with pregnancy. If left untreated, PCOS can lead to serious health problems, such as diabetes and heart disease.  CAUSES  The cause of PCOS is not fully understood, but genetics may be a factor.  SIGNS AND SYMPTOMS    Infrequent or no menstrual periods.    Inability to get pregnant (infertility) because of not ovulating.    Increased growth of hair on the face, chest, stomach, back, thumbs, thighs, or toes.    Acne, oily skin, or dandruff.    Pelvic pain.    Weight gain or obesity, usually carrying extra weight around the waist.    Type 2 diabetes.    High cholesterol.    High blood pressure.    Female-pattern baldness or thinning hair.    Patches of thickened and dark brown or black skin on the neck, arms, breasts, or thighs.    Tiny excess flaps of skin (skin tags) in the armpits or neck area.    Excessive snoring and having breathing stop at times while asleep (sleep apnea).    Deepening of the voice.    Gestational diabetes when pregnant.   DIAGNOSIS   There is no single test to diagnose PCOS.    Your health care provider will:    Take a medical history.    Perform a pelvic exam.    Have ultrasonography done.    Check your female and female hormone levels.    Measure glucose or sugar levels in the blood.    Do other blood tests.    If you are producing too many female hormones, your health care provider will make sure it is from PCOS. At the physical exam, your health care provider will want to evaluate the areas of increased hair growth. Try to allow natural hair growth for a few days before the visit.    During a pelvic exam, the ovaries may be  enlarged or swollen because of the increased number of small cysts. This can be seen more easily by using vaginal ultrasonography or screening to examine the ovaries and lining of the uterus (endometrium) for cysts. The uterine lining may become thicker if you have not been having a regular period.   TREATMENT   Because there is no cure for PCOS, it needs to be managed to prevent problems. Treatments are based on your symptoms. Treatment is also based on whether you want to have a baby or whether you need contraception.   Treatment may include:    Progesterone hormone to start a menstrual period.    Birth control pills to make you have regular menstrual periods.    Medicines to make you ovulate, if you want to get pregnant.    Medicines to control your insulin.    Medicine to control your blood pressure.    Medicine and diet to control your high cholesterol and triglycerides in your blood.   Medicine to reduce excessive hair growth.   Surgery, making small holes in the ovary, to decrease the amount of female hormone production. This is done through a long, lighted tube (laparoscope) placed into the pelvis through a tiny incision in the lower abdomen.   HOME CARE INSTRUCTIONS   Only   take over-the-counter or prescription medicine as directed by your health care provider.   Pay attention to the foods you eat and your activity levels. This can help reduce the effects of PCOS.   Keep your weight under control.   Eat foods that are low in carbohydrate and high in fiber.   Exercise regularly.  SEEK MEDICAL CARE IF:   Your symptoms do not get better with medicine.   You have new symptoms.  Document Released: 07/13/2004 Document Revised: 01/07/2013 Document Reviewed: 09/04/2012  [employer] Patient Information 2015 [employer]. This information is not intended to replace advice given to you by your health care provider. Make sure you discuss any questions you have with your health care provider.

## 2014-12-01 ENCOUNTER — Other Ambulatory Visit: Payer: BLUE CROSS/BLUE SHIELD

## 2014-12-01 ENCOUNTER — Ambulatory Visit: Payer: BLUE CROSS/BLUE SHIELD | Admitting: Women's Health

## 2014-12-09 ENCOUNTER — Encounter: Payer: Self-pay | Admitting: Physician Assistant

## 2014-12-09 ENCOUNTER — Ambulatory Visit (INDEPENDENT_AMBULATORY_CARE_PROVIDER_SITE_OTHER): Payer: BLUE CROSS/BLUE SHIELD | Admitting: Physician Assistant

## 2014-12-09 VITALS — BP 134/84 | HR 68 | Temp 98.9°F | Resp 18 | Wt 255.0 lb

## 2014-12-09 DIAGNOSIS — J988 Other specified respiratory disorders: Secondary | ICD-10-CM

## 2014-12-09 DIAGNOSIS — J020 Streptococcal pharyngitis: Secondary | ICD-10-CM | POA: Diagnosis not present

## 2014-12-09 DIAGNOSIS — B9689 Other specified bacterial agents as the cause of diseases classified elsewhere: Secondary | ICD-10-CM

## 2014-12-09 DIAGNOSIS — J029 Acute pharyngitis, unspecified: Secondary | ICD-10-CM | POA: Diagnosis not present

## 2014-12-09 LAB — RAPID STREP SCREEN (MED CTR MEBANE ONLY): Streptococcus, Group A Screen (Direct): POSITIVE — AB

## 2014-12-09 MED ORDER — AMOXICILLIN 500 MG PO CAPS: 500.0000 mg | ORAL_CAPSULE | Freq: Three times a day (TID) | ORAL | Status: DC

## 2014-12-09 NOTE — Progress Notes (Signed)
Patient ID: Melissa Young MRN: [id number], DOB: June 10, 1985, 29 y.o. Date of Encounter: 12/09/2014, 5:00 PM    Chief Complaint:  Chief Complaint  Patient presents with  . cough x 1 mth    still has and now ear pain and sore throat     HPI: 29 y.o. year old white female presents with above.  During our conversation she makes mention that I did prescribe her an anti-biotic in the past, which she took that with no improvement. Reviewed phone note 10/28/14 --I prescribed azithromycin Z-Pak. She states that she did complete this but had no improvement of symptoms.  She states that all this time she has been having symptoms off and on. Says that at times she is able to get drainage to come out then other times it won't come out then she'll have another period of time where she can get drainage out. For the most part though she feels that any drainage she is getting out is coming from just drainage in her throat and not deep down in her chest. Says that she had one week where her throat felt really sore and then now it just feels like it's her right ear and right side of her throat feels sore. Has had no fevers or chills. No other complaints or concerns.     Home Meds:   Outpatient Prescriptions Prior to Visit  Medication Sig Dispense Refill  . cetirizine (ZYRTEC) 10 MG tablet Take 10 mg by mouth daily.      . drospirenone-ethinyl estradiol (YAZ) 3-0.02 MG tablet Take 1 tablet by mouth daily. 3 Package 4  . fluticasone (FLONASE) 50 MCG/ACT nasal spray 2 sprays by Nasal route daily. #1 RFx2      No facility-administered medications prior to visit.    Allergies: No Known Allergies    Review of Systems: See HPI for pertinent ROS. All other ROS negative.    Physical Exam: Blood pressure 134/84, pulse 68, temperature 98.9 F (37.2 C), temperature source Oral, resp. rate 18, weight 255 lb (115.667 kg), last menstrual period 08/01/2014., Body mass index is 42.43 kg/(m^2). General:   Overweight white female. Appears in no acute distress. HEENT: Normocephalic, atraumatic, eyes without discharge, sclera non-icteric, nares are without discharge. Bilateral auditory canals clear, TM's are without perforation, pearly grey and translucent with reflective cone of light bilaterally. Right side of pharynx with mild erythema. No exudate seen. Remainder of pharynx with mild erythema. No exudate seen no peri-tonsillar abscess.  Neck: Supple. No thyromegaly. No lymphadenopathy. Lungs: Clear bilaterally to auscultation without wheezes, rales, or rhonchi. Breathing is unlabored. Heart: Regular rhythm. No murmurs, rubs, or gallops. Msk:  Strength and tone normal for age. Extremities/Skin: Warm and dry.No rashes. Neuro: Alert and oriented X 3. Moves all extremities spontaneously. Gait is normal. CNII-XII grossly in tact. Psych:  Responds to questions appropriately with a normal affect.   Results for orders placed or performed in visit on 12/09/14  Rapid strep screen (not at [facility])  Result Value Ref Range   Source THROAT    Streptococcus, Group A Screen (Direct) POS (A) NEGATIVE     ASSESSMENT AND PLAN:  29 y.o. year old female with  1. Strep pharyngitis - amoxicillin (AMOXIL) 500 MG capsule; Take 1 capsule (500 mg total) by mouth 3 (three) times daily.  Dispense: 30 capsule; Refill: 0  2. Bacterial respiratory infection - amoxicillin (AMOXIL) 500 MG capsule; Take 1 capsule (500 mg total) by mouth 3 (three) times daily.  Dispense:  30 capsule; Refill: 0  3. Sorethroat - Rapid strep screen (not at [facility])  She is to take antibiotic as directed complete all of it. Follow-up if symptoms do not resolve at completion of anti-biotic. Note given for out of work today and tomorrow. After that she is already off the weekend and will return to work Monday   Leanora Ivanoff [place], [place], [facility] 12/09/2014 5:00 PM

## 2015-01-03 ENCOUNTER — Encounter: Payer: Self-pay | Admitting: Physician Assistant

## 2015-01-10 ENCOUNTER — Ambulatory Visit (INDEPENDENT_AMBULATORY_CARE_PROVIDER_SITE_OTHER): Payer: BLUE CROSS/BLUE SHIELD | Admitting: Physician Assistant

## 2015-01-10 ENCOUNTER — Encounter: Payer: Self-pay | Admitting: Physician Assistant

## 2015-01-10 VITALS — BP 144/80 | HR 80 | Temp 98.4°F | Resp 18 | Wt 262.0 lb

## 2015-01-10 DIAGNOSIS — B9689 Other specified bacterial agents as the cause of diseases classified elsewhere: Secondary | ICD-10-CM

## 2015-01-10 DIAGNOSIS — R05 Cough: Secondary | ICD-10-CM | POA: Diagnosis not present

## 2015-01-10 DIAGNOSIS — R059 Cough, unspecified: Secondary | ICD-10-CM

## 2015-01-10 DIAGNOSIS — J988 Other specified respiratory disorders: Secondary | ICD-10-CM

## 2015-01-10 MED ORDER — FLUCONAZOLE 150 MG PO TABS: 150.0000 mg | ORAL_TABLET | Freq: Once | ORAL | Status: DC

## 2015-01-10 MED ORDER — LEVOFLOXACIN 750 MG PO TABS: 750.0000 mg | ORAL_TABLET | Freq: Every day | ORAL | Status: DC

## 2015-01-10 NOTE — Progress Notes (Signed)
Patient ID: Melissa Young MRN: [id number], DOB: 1986-03-14, 29 y.o. Date of Encounter: 01/10/2015, 3:37 PM    Chief Complaint:  Chief Complaint  Patient presents with  . still with cough x 2 months    on abx x 2, alot of chest tightness, HA     HPI: 29 y.o. year old white female presents with above.   I reviewed her chart. 10/28/14----prescribed a Z-Pak-----at follow-up she reported no improvement after completing that 12/09/14------ prescribed amoxicillin 500 mg 3 times a day 10 days---------- she reports no follow-up after completing that  Says that all of this time she has continued with cough. Says she doesn't feel horrible and doesn't feel really sick but says that she just has this cough that has not resolved. Says that in the morning is the worst and that's when she coughs up phlegm. Over this weekend saw yellow-green phlegm. Says that she "can feel "stuff " here" and points to her upper chest/ throat region. Says that she does not feel like she's got deep chest congestion but rather just in that area where she is pointing. As had no known fevers and no chills. No head or nasal congestion no sore throat no ear ache.     Home Meds:   Outpatient Prescriptions Prior to Visit  Medication Sig Dispense Refill  . cetirizine (ZYRTEC) 10 MG tablet Take 10 mg by mouth daily.      . drospirenone-ethinyl estradiol (YAZ) 3-0.02 MG tablet Take 1 tablet by mouth daily. 3 Package 4  . fluticasone (FLONASE) 50 MCG/ACT nasal spray 2 sprays by Nasal route daily. #1 RFx2     . amoxicillin (AMOXIL) 500 MG capsule Take 1 capsule (500 mg total) by mouth 3 (three) times daily. (Patient not taking: Reported on 01/10/2015) 30 capsule 0   No facility-administered medications prior to visit.    Allergies: No Known Allergies    Review of Systems: See HPI for pertinent ROS. All other ROS negative.    Physical Exam: Blood pressure 144/80, pulse 80, temperature 98.4 F (36.9 C), temperature  source Oral, resp. rate 18, weight 262 lb (118.842 kg)., Body mass index is 43.6 kg/(m^2). General:  Obese white female . Appears in no acute distress. HEENT: Normocephalic, atraumatic, eyes without discharge, sclera non-icteric, nares are without discharge. Bilateral auditory canals clear, TM's are without perforation, pearly grey and translucent with reflective cone of light bilaterally. Oral cavity moist, posterior pharynx without exudate, erythema, peritonsillar abscess.  Neck: Supple. No thyromegaly. No lymphadenopathy. Lungs: Clear bilaterally to auscultation without wheezes, rales, or rhonchi. Breathing is unlabored. Heart: Regular rhythm. No murmurs, rubs, or gallops. Msk:  Strength and tone normal for age. Extremities/Skin: Warm and dry.  Neuro: Alert and oriented X 3. Moves all extremities spontaneously. Gait is normal. CNII-XII grossly in tact. Psych:  Responds to questions appropriately with a normal affect.     ASSESSMENT AND PLAN:  29 y.o. year old female with  1. Cough - levofloxacin (LEVAQUIN) 750 MG tablet; Take 1 tablet (750 mg total) by mouth daily.  Dispense: 7 tablet; Refill: 0 - fluconazole (DIFLUCAN) 150 MG tablet; Take 1 tablet (150 mg total) by mouth once.  Dispense: 1 tablet; Refill: 0 - DG Chest 2 View; Future  2. Bacterial respiratory infection - levofloxacin (LEVAQUIN) 750 MG tablet; Take 1 tablet (750 mg total) by mouth daily.  Dispense: 7 tablet; Refill: 0 - fluconazole (DIFLUCAN) 150 MG tablet; Take 1 tablet (150 mg total) by mouth once.  Dispense:  1 tablet; Refill: 0 - DG Chest 2 View; Future  She is to take Levaquin. She is to take probiotic daily as this is her third and topotecan in the last several months. Will obtain chest x-ray if symptoms do not resolve with Levaquin. If symptoms do not resolve with Levaquin and chest x-ray is nondiagnostic then will refer to pulmonary.  [address] [place], [place], [facility] 01/10/2015 3:37 PM

## 2015-03-09 ENCOUNTER — Ambulatory Visit
Admission: RE | Admit: 2015-03-09 | Discharge: 2015-03-09 | Disposition: A | Discharge status: Home / Self Care | Payer: BLUE CROSS/BLUE SHIELD | Source: Ambulatory Visit | Attending: Physician Assistant | Admitting: Physician Assistant

## 2015-03-09 ENCOUNTER — Other Ambulatory Visit: Payer: Self-pay | Admitting: Physician Assistant

## 2015-03-09 DIAGNOSIS — R51 Headache: Secondary | ICD-10-CM | POA: Insufficient documentation

## 2015-03-09 DIAGNOSIS — R059 Cough, unspecified: Secondary | ICD-10-CM

## 2015-03-09 DIAGNOSIS — R05 Cough: Secondary | ICD-10-CM | POA: Insufficient documentation

## 2015-03-17 ENCOUNTER — Encounter: Payer: Self-pay | Admitting: Family Medicine

## 2015-03-17 ENCOUNTER — Other Ambulatory Visit: Payer: Self-pay | Admitting: Family Medicine

## 2015-03-17 DIAGNOSIS — R05 Cough: Secondary | ICD-10-CM

## 2015-03-17 DIAGNOSIS — R053 Chronic cough: Secondary | ICD-10-CM

## 2015-03-17 DIAGNOSIS — G4483 Primary cough headache: Secondary | ICD-10-CM

## 2015-05-23 ENCOUNTER — Ambulatory Visit: Payer: Self-pay | Admitting: Psychiatry

## 2015-05-24 ENCOUNTER — Encounter: Payer: Self-pay | Admitting: Psychiatry

## 2015-05-24 ENCOUNTER — Ambulatory Visit: Payer: Self-pay | Admitting: Psychiatry

## 2015-05-24 ENCOUNTER — Ambulatory Visit (INDEPENDENT_AMBULATORY_CARE_PROVIDER_SITE_OTHER): Payer: No Typology Code available for payment source | Admitting: Psychiatry

## 2015-05-24 DIAGNOSIS — F5081 Binge eating disorder: Secondary | ICD-10-CM | POA: Diagnosis not present

## 2015-05-24 MED ORDER — LISDEXAMFETAMINE DIMESYLATE 20 MG PO CAPS: 20.0000 mg | ORAL_CAPSULE | Freq: Every day | ORAL | Status: DC
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
## 2015-06-07 ENCOUNTER — Encounter: Payer: Self-pay | Admitting: Psychiatry

## 2015-06-07 ENCOUNTER — Ambulatory Visit (INDEPENDENT_AMBULATORY_CARE_PROVIDER_SITE_OTHER): Payer: Managed Care, Other (non HMO) | Admitting: Psychiatry

## 2015-06-07 DIAGNOSIS — F5081 Binge eating disorder: Secondary | ICD-10-CM | POA: Diagnosis not present

## 2015-06-07 MED ORDER — LISDEXAMFETAMINE DIMESYLATE 30 MG PO CAPS: 30.0000 mg | ORAL_CAPSULE | Freq: Every day | ORAL | Status: DC

## 2015-06-07 NOTE — Progress Notes (Signed)
Patient ID: Melissa Young, female   DOB: [birth year], 30 y.o.   MRN: [id number]  Psychiatric Progress note  Patient Identification: Melissa Young MRN:  [id number] Date of Evaluation:  06/07/2015 Chief Complaint:   Chief Complaint    Follow-up; Medication Refill    binge eating  Visit Diagnosis: Binge eating disorder  History of Present Illness:  Patient is a 30 yo WF with history of binge eating disorder who is here for a follow up. She reports doing quite well on the Vyvanse at 20 mg however feels like it wears off a little bit early. States that she has been more focused at her job and feeling less stressed. She is sleeping well. She has been trying to eat healthy. She has started working with a Systems analyst and is excited about it. Denies any problems with mood or anxiety. She states that at around 4:30 or 5 in the evening when she goes home she has intense hunger and feels like it could be a trigger for some binging. Discussed with her different ways to decraese the intense hunger, to have small healthy snacks prior to dinner.  Past Medical History:  Past Medical History  Diagnosis Date  . Seasonal allergies   . Anxiety   . Depression   . ADHD (attention deficit hyperactivity disorder)   . Asthma    History reviewed. No pertinent past surgical history. Family History:  Family History  Problem Relation Age of Onset  . Alcohol abuse Maternal Grandmother   . Anxiety disorder Maternal Grandmother   . Anxiety disorder Mother   . Drug abuse Mother   . Drug abuse Father    Social History:   Social History   Social History  . Marital Status: Single    Spouse Name: N/A  . Number of Children: N/A  . Years of Education: N/A   Social History Main Topics  . Smoking status: Never Smoker   . Smokeless tobacco: Never Used  . Alcohol Use: No     Comment: occasionally  . Drug Use: No  . Sexual Activity: Yes    Birth Control/ Protection: , Pill     Comment: INTERCOURSE AGE 30,  SEXUAL PARTNERS MORE THAN 5   Other Topics Concern  . None   Social History Narrative   Additional Social History: Patient is in a relationship, previously divorced. No children, works at a preschool.  Musculoskeletal: Strength & Muscle Tone: within normal limits Gait & Station: normal Patient leans: N/A  Psychiatric Specialty Exam: HPI  ROS  Blood pressure 122/78, pulse 100, temperature 98.1 F (36.7 C), temperature source Tympanic, height  (1.651 m), weight 269 lb 12.8 oz (122.38 kg), last menstrual period 05/04/2015, SpO2 99 %.Body mass index is 44.9 kg/(m^2).  General Appearance: Casual  Eye Contact:  Fair  Speech:  Clear and Coherent  Volume:  Normal  Mood:  Euthymic  Affect:  Congruent  Thought Process:  Coherent  Orientation:  Full (Time, Place, and Person)  Thought Content:  WDL  Suicidal Thoughts:  No  Homicidal Thoughts:  No  Memory:  Immediate;   Fair Recent;   Fair Remote;   Fair  Judgement:  Fair  Insight:  Fair  Psychomotor Activity:  Normal  Concentration:  Fair  Recall:  [employer] of Knowledge:Fair  Language: Fair  Akathisia:  No  Handed:  Right  AIMS (if indicated):    Assets:  Communication Skills Desire for Improvement Financial Resources/Insurance Housing Social Support Vocational/Educational  ADL's:  Intact  Cognition: WNL  Sleep:  fair   Is the patient at risk to self?  No. Has the patient been a risk to self in the past 6 months?  No. Has the patient been a risk to self within the distant past?  No. Is the patient a risk to others?  No. Has the patient been a risk to others in the past 6 months?  No. Has the patient been a risk to others within the distant past?  No.  Allergies:  No Known Allergies Current Medications: Current Outpatient Prescriptions  Medication Sig Dispense Refill  . cetirizine (ZYRTEC) 10 MG tablet Take 10 mg by mouth daily.      . drospirenone-ethinyl estradiol (YAZ) 3-0.02 MG tablet Take 1 tablet by  mouth daily. 3 Package 4  . fluticasone (FLONASE) 50 MCG/ACT nasal spray 2 sprays by Nasal route daily. #1 RFx2     . lisdexamfetamine (VYVANSE) 20 MG capsule Take 1 capsule (20 mg total) by mouth daily. 30 capsule 0   No current facility-administered medications for this visit.    Previous Psychotropic Medications: Yes   Substance Abuse History in the last 12 months:  No.  Consequences of Substance Abuse: Negative  Medical Decision Making:  Review of Psycho-Social Stressors (1), Review and summation of old records (2), Established Problem, Worsening (2) and Review of New Medication or Change in Dosage (2)  Treatment Plan Summary: Medication management   Binge eating disorder Increase Vyvanse to 30 mg once daily. Continue to work out and work with a Systems analystpersonal trainer Take the Vyvanse a little later in the morning at around 8:30. Discussed strategies to work with the evenings, such as eating an apple and eating some nuts prior to dinner. Planning for her meals as before.  RTC to clinic in 1 month's time or call before if needed.      Santonio Speakman 3/7/20173:08 PM

## 2015-07-25 ENCOUNTER — Ambulatory Visit (INDEPENDENT_AMBULATORY_CARE_PROVIDER_SITE_OTHER): Payer: No Typology Code available for payment source | Admitting: Psychiatry

## 2015-07-25 ENCOUNTER — Encounter: Payer: Self-pay | Admitting: Psychiatry

## 2015-07-25 VITALS — BP 122/86 | HR 109 | Temp 98.0°F | Ht 65.0 in | Wt 268.4 lb

## 2015-07-25 DIAGNOSIS — F5081 Binge eating disorder: Secondary | ICD-10-CM

## 2015-07-25 MED ORDER — LISDEXAMFETAMINE DIMESYLATE 30 MG PO CAPS: 30.0000 mg | ORAL_CAPSULE | Freq: Every day | ORAL | Status: DC

## 2015-07-25 NOTE — Progress Notes (Signed)
Patient ID: Melissa Young, female   DOB: 02/20/1986, 30 y.o.   MRN: [id number]   Psychiatric Progress note  Patient Identification: Melissa Young MRN:  [id number] Date of Evaluation:  07/25/2015 Chief Complaint:   Chief Complaint    Follow-up; Medication Refill    binge eating  Visit Diagnosis: Binge eating disorder  History of Present Illness:  Patient is a 30 yo WF with history of binge eating disorder who is here for a follow up. Patient reports that she has been doing quite well on the increased dose of Vyvanse at 30 mg. States that taking it a little later in the morning is making it last  longer in the evening. She has not had a single binging episodes since her last appointment. She is been working out with a Systems analystpersonal trainer on Tuesdays and Thursdays. 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  She denies any mood issues. States that she is doing well overall. She is in school and will obtain her Associates degree this summer and is looking to starting university at [employer] in the fall.  Past Medical History:  Past Medical History  Diagnosis Date  . Seasonal allergies   . Anxiety   . Depression   . ADHD (attention deficit hyperactivity disorder)   . Asthma    History reviewed. No pertinent past surgical history. Family History:  Family History  Problem Relation Age of Onset  . Alcohol abuse Maternal Grandmother   . Anxiety disorder Maternal Grandmother   . Anxiety disorder Mother   . Drug abuse Mother   . Drug abuse Father    Social History:   Social History   Social History  . Marital Status: Single    Spouse Name: N/A  . Number of Children: N/A  . Years of Education: N/A   Social History Main Topics  . Smoking status: Never Smoker   . Smokeless tobacco: Never Used  . Alcohol Use: No     Comment: occasionally  . Drug Use: No  . Sexual Activity: Yes    Birth Control/ Protection: , Pill     Comment: INTERCOURSE AGE 35, SEXUAL PARTNERS MORE  THAN 5   Other Topics Concern  . None   Social History Narrative   Additional Social History: Patient is in a relationship, previously divorced. No children, works at a preschool.  Musculoskeletal: Strength & Muscle Tone: within normal limits Gait & Station: normal Patient leans: N/A  Psychiatric Specialty Exam: HPI  ROS  Blood pressure 122/86, pulse 109, temperature 98 F (36.7 C), temperature source Tympanic, height 5\' 5"  (1.651 m), weight 268 lb 6.4 oz (121.745 kg), last menstrual period 07/04/2015, SpO2 99 %.Body mass index is 44.66 kg/(m^2).  General Appearance: Casual  Eye Contact:  Fair  Speech:  Clear and Coherent  Volume:  Normal  Mood:  Euthymic  Affect:  Congruent  Thought Process:  Coherent  Orientation:  Full (Time, Place, and Person)  Thought Content:  WDL  Suicidal Thoughts:  No  Homicidal Thoughts:  No  Memory:  Immediate;   Fair Recent;   Fair Remote;   Fair  Judgement:  Fair  Insight:  Fair  Psychomotor Activity:  Normal  Concentration:  Fair  Recall:  [employer]  Fund of Knowledge:Fair  Language: Fair  Akathisia:  No  Handed:  Right  AIMS (if indicated):    Assets:  Communication Skills Desire for Improvement Financial Resources/Insurance Housing Social Support Vocational/Educational  ADL's:  Intact  Cognition: WNL  Sleep:  fair   Is the patient at risk to self?  No. Has the patient been a risk to self in the past 6 months?  No. Has the patient been a risk to self within the distant past?  No. Is the patient a risk to others?  No. Has the patient been a risk to others in the past 6 months?  No. Has the patient been a risk to others within the distant past?  No.  Allergies:  No Known Allergies Current Medications: Current Outpatient Prescriptions  Medication Sig Dispense Refill  . cetirizine (ZYRTEC) 10 MG tablet Take 10 mg by mouth daily.      . drospirenone-ethinyl estradiol (YAZ) 3-0.02 MG tablet Take 1 tablet by mouth daily. 3 Package 4   . fluticasone (FLONASE) 50 MCG/ACT nasal spray 2 sprays by Nasal route daily. #1 RFx2     . lisdexamfetamine (VYVANSE) 30 MG capsule Take 1 capsule (30 mg total) by mouth daily. 30 capsule 0   No current facility-administered medications for this visit.    Previous Psychotropic Medications: Yes   Substance Abuse History in the last 12 months:  No.  Consequences of Substance Abuse: Negative  Medical Decision Making:  Review of Psycho-Social Stressors (1), Review and summation of old records (2), Established Problem, Worsening (2) and Review of New Medication or Change in Dosage (2)  Treatment Plan Summary: Medication management   Binge eating disorder Continue Vyvanse at 30 mg once daily. Continue to work out and work with a Systems analyst  RTC to clinic in 2 month's time or call before if needed.      Anastasha Ortez 4/24/20172:24 PM

## 2015-08-24 ENCOUNTER — Encounter: Payer: Self-pay | Admitting: Women's Health

## 2015-08-26 ENCOUNTER — Other Ambulatory Visit: Payer: Self-pay | Admitting: Women's Health

## 2015-08-26 MED ORDER — METFORMIN HCL 500 MG PO TABS: 500.0000 mg | ORAL_TABLET | Freq: Two times a day (BID) | ORAL | Status: DC

## 2015-10-10 ENCOUNTER — Ambulatory Visit: Payer: Managed Care, Other (non HMO) | Admitting: Psychiatry

## 2015-11-24 ENCOUNTER — Encounter: Payer: BLUE CROSS/BLUE SHIELD | Admitting: Physician Assistant

## 2016-01-17 ENCOUNTER — Encounter: Payer: BLUE CROSS/BLUE SHIELD | Admitting: Women's Health

## 2016-03-12 ENCOUNTER — Encounter: Payer: Self-pay | Admitting: Physician Assistant

## 2016-03-12 ENCOUNTER — Ambulatory Visit (INDEPENDENT_AMBULATORY_CARE_PROVIDER_SITE_OTHER): Payer: BC Managed Care – PPO | Admitting: Physician Assistant

## 2016-03-12 VITALS — BP 130/82 | HR 87 | Temp 97.9°F | Resp 16 | Wt 272.0 lb

## 2016-03-12 DIAGNOSIS — E282 Polycystic ovarian syndrome: Secondary | ICD-10-CM

## 2016-03-12 NOTE — Progress Notes (Signed)
Patient ID: Melissa Young MRN: [id number], DOB: 08/12/1985, 30 y.o. Date of Encounter: 03/12/2016, 3:59 PM    Chief Complaint:  Chief Complaint  Patient presents with  . pcos symptoms  . Fatigue  . Weight Gain     HPI: 30 y.o. year old female presents with above.   I reviewed her note by GYN which is in epic and that note is dated 11/19/14. At that time she had been having findings suspicious for possible PCOS. She had periods of amenorrhea when not on contraception. She had had problems with weight gain and mild hirsutism and did not conceive when off OCs. Performed ultrasound that day. 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 Their note states that they planned for repeat ultrasound in 3 months to check cyst with questionable dermoid plug. The planned to continue Yaz and call if no withdrawal bleeding after completing 2 months. Reviewed importance of regular exercise, cutting calories for weight loss.  Today patient states that she got married in July. States that she went off of her OCT in May or June because they knew they wanted to try to have a baby. Says that one of her periods came a little late. Now she has missed this last period. Has done a pregnancy test that was negative. Says that she is doing tracking for both the PCO S and also ovulation as she wants to conceive.  Says that she had a trainer for 6 months and met with the trainer 2 times per week and went to the gym at least 4 times per week during that time. Had very little change/weight loss. This was expensive and she was seeing very little benefits so she stopped this.  She wasn't sure what she should do next. Still having significant fatigue weight gain and PCOS symptoms.also wants to conceive.      Home Meds:   Outpatient Medications Prior to Visit  Medication Sig Dispense Refill  . cetirizine (ZYRTEC) 10 MG tablet Take 10 mg by mouth daily.      . fluticasone (FLONASE) 50 MCG/ACT nasal spray 2 sprays by Nasal  route daily. #1 RFx2     . drospirenone-ethinyl estradiol (YAZ) 3-0.02 MG tablet Take 1 tablet by mouth daily. (Patient not taking: Reported on 03/12/2016) 3 Package 4  . lisdexamfetamine (VYVANSE) 30 MG capsule Take 1 capsule (30 mg total) by mouth daily. (Patient not taking: Reported on 03/12/2016) 30 capsule 0  . metFORMIN (GLUCOPHAGE) 500 MG tablet Take 1 tablet (500 mg total) by mouth 2 (two) times daily with a meal. (Patient not taking: Reported on 03/12/2016) 180 tablet 1   No facility-administered medications prior to visit.     Allergies: No Known Allergies    Review of Systems: See HPI for pertinent ROS. All other ROS negative.    Physical Exam: Blood pressure 130/82, pulse 87, temperature 97.9 F (36.6 C), temperature source Oral, resp. rate 16, weight 272 lb (123.4 kg), last menstrual period 01/08/2016, SpO2 99 %., Body mass index is 45.26 kg/m. General:  Obese WF. Appears in no acute distress. Neck: Supple. No thyromegaly. No lymphadenopathy. Lungs: Clear bilaterally to auscultation without wheezes, rales, or rhonchi. Breathing is unlabored. Heart: Regular rhythm. No murmurs, rubs, or gallops. Msk:  Strength and tone normal for age. Extremities/Skin: Warm and dry.  Neuro: Alert and oriented X 3. Moves all extremities spontaneously. Gait is normal. CNII-XII grossly in tact. Psych:  Responds to questions appropriately with a normal affect.  ASSESSMENT AND PLAN:  30 y.o. year old female with  1. PCOS (polycystic ovarian syndrome) Recommend that she follow up with Elon Alas NP/Dr. Phineas Real at gynecology. She is agreeable with this approach. She also requests any information I have regarding diet changes---I gave and reviewed handout for low carbohydrate diet. Reviewed that portion of the form but also reviewed foods that she can and should eat. - Ambulatory referral to Gynecology   Signed, Olean Ree Cumming, [place], [facility] 03/12/2016 3:59 PM

## 2016-04-05 ENCOUNTER — Ambulatory Visit (INDEPENDENT_AMBULATORY_CARE_PROVIDER_SITE_OTHER): Payer: BC Managed Care – PPO | Admitting: Women's Health

## 2016-04-05 ENCOUNTER — Encounter: Payer: Self-pay | Admitting: Women's Health

## 2016-04-05 VITALS — BP 120/82 | Ht 65.0 in | Wt 274.0 lb

## 2016-04-05 DIAGNOSIS — Z01419 Encounter for gynecological examination (general) (routine) without abnormal findings: Secondary | ICD-10-CM

## 2016-04-05 DIAGNOSIS — N912 Amenorrhea, unspecified: Secondary | ICD-10-CM

## 2016-04-05 DIAGNOSIS — E282 Polycystic ovarian syndrome: Secondary | ICD-10-CM

## 2016-04-05 LAB — CBC WITH DIFFERENTIAL/PLATELET
Basophils Absolute: 0 cells/uL (ref 0–200)
Basophils Relative: 0 %
EOS ABS: 142 {cells}/uL (ref 15–500)
Eosinophils Relative: 2 %
HEMATOCRIT: 42.5 % (ref 35.0–45.0)
Hemoglobin: 14.2 g/dL (ref 11.7–15.5)
LYMPHS PCT: 37 %
Lymphs Abs: 2627 cells/uL (ref 850–3900)
MCH: 30.3 pg (ref 27.0–33.0)
MCHC: 33.4 g/dL (ref 32.0–36.0)
MCV: 90.8 fL (ref 80.0–100.0)
MONOS PCT: 8 %
MPV: 10.1 fL (ref 7.5–12.5)
Monocytes Absolute: 568 cells/uL (ref 200–950)
NEUTROS PCT: 53 %
Neutro Abs: 3763 cells/uL (ref 1500–7800)
PLATELETS: 329 10*3/uL (ref 140–400)
RBC: 4.68 MIL/uL (ref 3.80–5.10)
RDW: 13.4 % (ref 11.0–15.0)
WBC: 7.1 10*3/uL (ref 3.8–10.8)

## 2016-04-05 LAB — TSH: TSH: 2.19 mIU/L

## 2016-04-05 LAB — GLUCOSE, RANDOM: Glucose, Bld: 87 mg/dL (ref 65–99)

## 2016-04-05 MED ORDER — MEDROXYPROGESTERONE ACETATE 10 MG PO TABS: 10.0000 mg | ORAL_TABLET | Freq: Every day | ORAL | 0 refills | Status: DC

## 2016-04-05 MED ORDER — METFORMIN HCL 1000 MG PO TABS: 1000.0000 mg | ORAL_TABLET | Freq: Two times a day (BID) | ORAL | 6 refills | Status: DC

## 2016-04-05 NOTE — Patient Instructions (Signed)
Health Maintenance, Female Introduction Adopting a healthy lifestyle and getting preventive care can go a long way to promote health and wellness. Talk with your health care provider about what schedule of regular examinations is right for you. This is a good chance for you to check in with your provider about disease prevention and staying healthy. In between checkups, there are plenty of things you can do on your own. Experts have done a lot of research about which lifestyle changes and preventive measures are most likely to keep you healthy. Ask your health care provider for more information. Weight and diet Eat a healthy diet  Be sure to include plenty of vegetables, fruits, low-fat dairy products, and lean protein.  Do not eat a lot of foods high in solid fats, added sugars, or salt.  Get regular exercise. This is one of the most important things you can do for your health.  Most adults should exercise for at least 150 minutes each week. The exercise should increase your heart rate and make you sweat (moderate-intensity exercise).  Most adults should also do strengthening exercises at least twice a week. This is in addition to the moderate-intensity exercise. Maintain a healthy weight  Body mass index (BMI) is a measurement that can be used to identify possible weight problems. It estimates body fat based on height and weight. Your health care provider can help determine your BMI and help you achieve or maintain a healthy weight.  For females 63 years of age and older:  A BMI below 18.5 is considered underweight.  A BMI of 18.5 to 24.9 is normal.  A BMI of 25 to 29.9 is considered overweight.  A BMI of 30 and above is considered obese. Watch levels of cholesterol and blood lipids  You should start having your blood tested for lipids and cholesterol at 30 years of age, then have this test every 5 years.  You may need to have your cholesterol levels checked more often if:  Your  lipid or cholesterol levels are high.  You are older than 30 years of age.  You are at high risk for heart disease. Cancer screening Lung Cancer  Lung cancer screening is recommended for adults 56-22 years old who are at high risk for lung cancer because of a history of smoking.  A yearly low-dose CT scan of the lungs is recommended for people who:  Currently smoke.  Have quit within the past 15 years.  Have at least a 30-pack-year history of smoking. A pack year is smoking an average of one pack of cigarettes a day for 1 year.  Yearly screening should continue until it has been 15 years since you quit.  Yearly screening should stop if you develop a health problem that would prevent you from having lung cancer treatment. Breast Cancer  Practice breast self-awareness. This means understanding how your breasts normally appear and feel.  It also means doing regular breast self-exams. Let your health care provider know about any changes, no matter how small.  If you are in your 20s or 30s, you should have a clinical breast exam (CBE) by a health care provider every 1-3 years as part of a regular health exam.  If you are 35 or older, have a CBE every year. Also consider having a breast X-ray (mammogram) every year.  If you have a family history of breast cancer, talk to your health care provider about genetic screening.  If you are at high risk for breast cancer,  talk to your health care provider about having an MRI and a mammogram every year.  Breast cancer gene (BRCA) assessment is recommended for women who have family members with BRCA-related cancers. BRCA-related cancers include:  Breast.  Ovarian.  Tubal.  Peritoneal cancers.  Results of the assessment will determine the need for genetic counseling and BRCA1 and BRCA2 testing. Cervical Cancer  Your health care provider may recommend that you be screened regularly for cancer of the pelvic organs (ovaries, uterus, and  vagina). This screening involves a pelvic examination, including checking for microscopic changes to the surface of your cervix (Pap test). You may be encouraged to have this screening done every 3 years, beginning at age 21.  For women ages 30-65, health care providers may recommend pelvic exams and Pap testing every 3 years, or they may recommend the Pap and pelvic exam, combined with testing for human papilloma virus (HPV), every 5 years. Some types of HPV increase your risk of cervical cancer. Testing for HPV may also be done on women of any age with unclear Pap test results.  Other health care providers may not recommend any screening for nonpregnant women who are considered low risk for pelvic cancer and who do not have symptoms. Ask your health care provider if a screening pelvic exam is right for you.  If you have had past treatment for cervical cancer or a condition that could lead to cancer, you need Pap tests and screening for cancer for at least 20 years after your treatment. If Pap tests have been discontinued, your risk factors (such as having a new sexual partner) need to be reassessed to determine if screening should resume. Some women have medical problems that increase the chance of getting cervical cancer. In these cases, your health care provider may recommend more frequent screening and Pap tests. Colorectal Cancer  This type of cancer can be detected and often prevented.  Routine colorectal cancer screening usually begins at 30 years of age and continues through 30 years of age.  Your health care provider may recommend screening at an earlier age if you have risk factors for colon cancer.  Your health care provider may also recommend using home test kits to check for hidden blood in the stool.  A small camera at the end of a tube can be used to examine your colon directly (sigmoidoscopy or colonoscopy). This is done to check for the earliest forms of colorectal  cancer.  Routine screening usually begins at age 50.  Direct examination of the colon should be repeated every 5-10 years through 30 years of age. However, you may need to be screened more often if early forms of precancerous polyps or small growths are found. Skin Cancer  Check your skin from head to toe regularly.  Tell your health care provider about any new moles or changes in moles, especially if there is a change in a mole's shape or color.  Also tell your health care provider if you have a mole that is larger than the size of a pencil eraser.  Always use sunscreen. Apply sunscreen liberally and repeatedly throughout the day.  Protect yourself by wearing long sleeves, pants, a wide-brimmed hat, and sunglasses whenever you are outside. Heart disease, diabetes, and high blood pressure  High blood pressure causes heart disease and increases the risk of stroke. High blood pressure is more likely to develop in:  People who have blood pressure in the high end of the normal range (130-139/85-89 mm Hg).    People who are overweight or obese.  People who are African American.  If you are 18-39 years of age, have your blood pressure checked every 3-5 years. If you are 40 years of age or older, have your blood pressure checked every year. You should have your blood pressure measured twice-once when you are at a hospital or clinic, and once when you are not at a hospital or clinic. Record the average of the two measurements. To check your blood pressure when you are not at a hospital or clinic, you can use:  An automated blood pressure machine at a pharmacy.  A home blood pressure monitor.  If you are between 55 years and 79 years old, ask your health care provider if you should take aspirin to prevent strokes.  Have regular diabetes screenings. This involves taking a blood sample to check your fasting blood sugar level.  If you are at a normal weight and have a low risk for diabetes,  have this test once every three years after 30 years of age.  If you are overweight and have a high risk for diabetes, consider being tested at a younger age or more often. Preventing infection Hepatitis B  If you have a higher risk for hepatitis B, you should be screened for this virus. You are considered at high risk for hepatitis B if:  You were born in a country where hepatitis B is common. Ask your health care provider which countries are considered high risk.  Your parents were born in a high-risk country, and you have not been immunized against hepatitis B (hepatitis B vaccine).  You have HIV or AIDS.  You use needles to inject street drugs.  You live with someone who has hepatitis B.  You have had sex with someone who has hepatitis B.  You get hemodialysis treatment.  You take certain medicines for conditions, including cancer, organ transplantation, and autoimmune conditions. Hepatitis C  Blood testing is recommended for:  Everyone born from 1945 through 1965.  Anyone with known risk factors for hepatitis C. Sexually transmitted infections (STIs)  You should be screened for sexually transmitted infections (STIs) including gonorrhea and chlamydia if:  You are sexually active and are younger than 30 years of age.  You are older than 30 years of age and your health care provider tells you that you are at risk for this type of infection.  Your sexual activity has changed since you were last screened and you are at an increased risk for chlamydia or gonorrhea. Ask your health care provider if you are at risk.  If you do not have HIV, but are at risk, it may be recommended that you take a prescription medicine daily to prevent HIV infection. This is called pre-exposure prophylaxis (PrEP). You are considered at risk if:  You are sexually active and do not regularly use condoms or know the HIV status of your partner(s).  You take drugs by injection.  You are sexually  active with a partner who has HIV. Talk with your health care provider about whether you are at high risk of being infected with HIV. If you choose to begin PrEP, you should first be tested for HIV. You should then be tested every 3 months for as long as you are taking PrEP. Pregnancy  If you are premenopausal and you may become pregnant, ask your health care provider about preconception counseling.  If you may become pregnant, take 400 to 800 micrograms (mcg) of folic acid   every day.  If you want to prevent pregnancy, talk to your health care provider about birth control (contraception). Osteoporosis and menopause  Osteoporosis is a disease in which the bones lose minerals and strength with aging. This can result in serious bone fractures. Your risk for osteoporosis can be identified using a bone density scan.  If you are 43 years of age or older, or if you are at risk for osteoporosis and fractures, ask your health care provider if you should be screened.  Ask your health care provider whether you should take a calcium or vitamin D supplement to lower your risk for osteoporosis.  Menopause may have certain physical symptoms and risks.  Hormone replacement therapy may reduce some of these symptoms and risks. Talk to your health care provider about whether hormone replacement therapy is right for you. Follow these instructions at home:  Schedule regular health, dental, and eye exams.  Stay current with your immunizations.  Do not use any tobacco products including cigarettes, chewing tobacco, or electronic cigarettes.  If you are pregnant, do not drink alcohol.  If you are breastfeeding, limit how much and how often you drink alcohol.  Limit alcohol intake to no more than 1 drink per day for nonpregnant women. One drink equals 12 ounces of beer, 5 ounces of wine, or 1 ounces of hard liquor.  Do not use street drugs.  Do not share needles.  Ask your health care provider for  help if you need support or information about quitting drugs.  Tell your health care provider if you often feel depressed.  Tell your health care provider if you have ever been abused or do not feel safe at home. This information is not intended to replace advice given to you by your health care provider. Make sure you discuss any questions you have with your health care provider. Document Released: 10/02/2010 Document Revised: 08/25/2015 Document Reviewed: 12/21/2014  2017 Elsevier First Trimester of Pregnancy The first trimester of pregnancy is from week 1 until the end of week 12 (months 1 through 3). During this time, your baby will begin to develop inside you. At 6-8 weeks, the eyes and face are formed, and the heartbeat can be seen on ultrasound. At the end of 12 weeks, all the baby's organs are formed. Prenatal care is all the medical care you receive before the birth of your baby. Make sure you get good prenatal care and follow all of your doctor's instructions. Follow these instructions at home: Medicines  Take medicine only as told by your doctor. Some medicines are safe and some are not during pregnancy.  Take your prenatal vitamins as told by your doctor.  Take medicine that helps you poop (stool softener) as needed if your doctor says it is okay. Diet  Eat regular, healthy meals.  Your doctor will tell you the amount of weight gain that is right for you.  Avoid raw meat and uncooked cheese.  If you feel sick to your stomach (nauseous) or throw up (vomit):  Eat 4 or 5 small meals a day instead of 3 large meals.  Try eating a few soda crackers.  Drink liquids between meals instead of during meals.  If you have a hard time pooping (constipation):  Eat high-fiber foods like fresh vegetables, fruit, and whole grains.  Drink enough fluids to keep your pee (urine) clear or pale yellow. Activity and Exercise  Exercise only as told by your doctor. Stop exercising if you  have cramps  or pain in your lower belly (abdomen) or low back.  Try to avoid standing for long periods of time. Move your legs often if you must stand in one place for a long time.  Avoid heavy lifting.  Wear low-heeled shoes. Sit and stand up straight.  You can have sex unless your doctor tells you not to. Relief of Pain or Discomfort  Wear a good support bra if your breasts are sore.  Take warm water baths (sitz baths) to soothe pain or discomfort caused by hemorrhoids. Use hemorrhoid cream if your doctor says it is okay.  Rest with your legs raised if you have leg cramps or low back pain.  Wear support hose if you have puffy, bulging veins (varicose veins) in your legs. Raise (elevate) your feet for 15 minutes, 3-4 times a day. Limit salt in your diet. Prenatal Care  Schedule your prenatal visits by the twelfth week of pregnancy.  Write down your questions. Take them to your prenatal visits.  Keep all your prenatal visits as told by your doctor. Safety  Wear your seat belt at all times when driving.  Make a list of emergency phone numbers. The list should include numbers for family, friends, the hospital, and police and fire departments. General Tips  Ask your doctor for a referral to a local prenatal class. Begin classes no later than at the start of month 6 of your pregnancy.  Ask for help if you need counseling or help with nutrition. Your doctor can give you advice or tell you where to go for help.  Do not use hot tubs, steam rooms, or saunas.  Do not douche or use tampons or scented sanitary pads.  Do not cross your legs for long periods of time.  Avoid litter boxes and soil used by cats.  Avoid all smoking, herbs, and alcohol. Avoid drugs not approved by your doctor.  Do not use any tobacco products, including cigarettes, chewing tobacco, and electronic cigarettes. If you need help quitting, ask your doctor. You may get counseling or other support to help you  quit.  Visit your dentist. At home, brush your teeth with a soft toothbrush. Be gentle when you floss. Get help if:  You are dizzy.  You have mild cramps or pressure in your lower belly.  You have a nagging pain in your belly area.  You continue to feel sick to your stomach, throw up, or have watery poop (diarrhea).  You have a bad smelling fluid coming from your vagina.  You have pain with peeing (urination).  You have increased puffiness (swelling) in your face, hands, legs, or ankles. Get help right away if:  You have a fever.  You are leaking fluid from your vagina.  You have spotting or bleeding from your vagina.  You have very bad belly cramping or pain.  You gain or lose weight rapidly.  You throw up blood. It may look like coffee grounds.  You are around people who have [place] measles, fifth disease, or chickenpox.  You have a very bad headache.  You have shortness of breath.  You have any kind of trauma, such as from a fall or a car accident. This information is not intended to replace advice given to you by your health care provider. Make sure you discuss any questions you have with your health care provider. Document Released: 09/05/2007 Document Revised: 08/25/2015 Document Reviewed: 01/27/2013 Elsevier Interactive Patient Education  2017 [employer].

## 2016-04-05 NOTE — Progress Notes (Signed)
Melissa Young 01/12/1986 [id number]    History:    Presents for annual exam. Desires conception, has used no contraception since June 2017. History of PCOS with irregular periods. Had been on Glucophage having mostly  monthly cycles with no side effects but stopped Glucophage and has been amenorrheic since November. Ultrasound confirming PCOS. Normal Pap history. Same partner.   Past medical history, past surgical history, family history and social history were all reviewed and documented in the EPIC chart. Geologist, engineeringTeacher assistant.  ROS:  A ROS was performed and pertinent positives and negatives are included.  Exam:  Vitals:   04/05/16 1358  BP: 120/82  Weight: 274 lb (124.3 kg)  Height: 5\' 5"  (1.651 m)   Body mass index is 45.6 kg/m.   General appearance:  Normal Thyroid:  Symmetrical, normal in size, without palpable masses or nodularity. Respiratory  Auscultation:  Clear without wheezing or rhonchi Cardiovascular  Auscultation:  Regular rate, without rubs, murmurs or gallops  Edema/varicosities:  Not grossly evident Abdominal  Soft,nontender, without masses, guarding or rebound.  Liver/spleen:  No organomegaly noted  Hernia:  None appreciated  Skin  Inspection:  Grossly normal   Breasts: Examined lying and sitting.     Right: Without masses, retractions, discharge or axillary adenopathy.     Left: Without masses, retractions, discharge or axillary adenopathy. Gentitourinary   Inguinal/mons:  Normal without inguinal adenopathy  External genitalia:  Normal  BUS/Urethra/Skene's glands:  Normal  Vagina:  Normal  Cervix:  Normal  Uterus:   normal in size, shape and contour.  Midline and mobile  Adnexa/parametria:     Rt: Without masses or tenderness.   Lt: Without masses or tenderness.  Anus and perineum: Normal  Digital rectal exam: Normal sphincter tone without palpated masses or tenderness  Assessment/Plan:  31 y.o. MWF G0 for annual exam.    Desires  conception PCOS with amenorrhea Obesity  Plan: CBC, TSH, prolactin, glucose, UA, Pap normal 2016, new screening guidelines reviewed. Will check semen analysis, start back on Glucophage 500 mg twice daily and increase to 1000 mg twice daily in 2 weeks. If hCG qualitative is negative will take Provera 10 mg by mouth daily for 5 days for cycle. Instructed to to call if no cycle. If all labs are normal will refer for fertility management. SBE's, exercise, calcium rich diet, prenatal vitamin daily encouraged. Reviewed importance of weight loss.   Harrington ChallengerYOUNG,NANCY J A[facility] Sacred Heart HospitalWHNP, 2:33 PM 04/05/2016

## 2016-04-06 ENCOUNTER — Encounter: Payer: Self-pay | Admitting: Women's Health

## 2016-04-06 LAB — PROLACTIN: PROLACTIN: 12.4 ng/mL

## 2016-04-06 LAB — HCG, SERUM, QUALITATIVE: Preg, Serum: NEGATIVE

## 2016-04-09 ENCOUNTER — Encounter: Payer: Self-pay | Admitting: Women's Health

## 2016-04-10 ENCOUNTER — Telehealth: Payer: Self-pay | Admitting: *Deleted

## 2016-04-10 NOTE — Telephone Encounter (Signed)
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  Normal TSH and prolactin, irreg cycles with amenorrhea.  thanks

## 2016-04-10 NOTE — Telephone Encounter (Signed)
Referral faxed to Dr.Yalcinkaya office they will contact pt to schedule and fax me back with time and date.

## 2016-04-16 NOTE — Telephone Encounter (Signed)
Pt scheduled on 05/08/16 @ 3:00pm

## 2016-05-08 ENCOUNTER — Encounter: Payer: Self-pay | Admitting: Women's Health

## 2016-06-22 ENCOUNTER — Encounter: Payer: Self-pay | Admitting: Women's Health

## 2016-06-28 NOTE — Telephone Encounter (Signed)
Telephone call, reviewed normal semen analysis. Currently seeing Dr. April MansonYalcinkaya for fertility.

## 2016-08-15 ENCOUNTER — Encounter: Payer: Self-pay | Admitting: Gynecology

## 2016-08-22 ENCOUNTER — Encounter: Payer: Self-pay | Admitting: Physician Assistant

## 2016-08-22 ENCOUNTER — Ambulatory Visit (INDEPENDENT_AMBULATORY_CARE_PROVIDER_SITE_OTHER): Payer: BC Managed Care – PPO | Admitting: Physician Assistant

## 2016-08-22 VITALS — BP 126/84 | HR 77 | Temp 97.6°F | Resp 16 | Wt 274.4 lb

## 2016-08-22 DIAGNOSIS — B9689 Other specified bacterial agents as the cause of diseases classified elsewhere: Secondary | ICD-10-CM

## 2016-08-22 DIAGNOSIS — J029 Acute pharyngitis, unspecified: Secondary | ICD-10-CM

## 2016-08-22 DIAGNOSIS — J988 Other specified respiratory disorders: Secondary | ICD-10-CM

## 2016-08-22 LAB — STREP GROUP A AG, W/REFLEX TO CULT: STREGTOCOCCUS GROUP A AG SCREEN: NOT DETECTED

## 2016-08-22 MED ORDER — AMOXICILLIN 875 MG PO TABS: 875.0000 mg | ORAL_TABLET | Freq: Two times a day (BID) | ORAL | 0 refills | Status: DC

## 2016-08-22 MED ORDER — FLUCONAZOLE 150 MG PO TABS: 150.0000 mg | ORAL_TABLET | Freq: Once | ORAL | 0 refills | Status: AC

## 2016-08-22 NOTE — Progress Notes (Signed)
    Patient ID: Melissa Young MRN: [id number], DOB: 05/26/1985, 31 y.o. Date of Encounter: 08/22/2016, 10:34 AM    Chief Complaint:  Chief Complaint  Patient presents with  . Nasal Congestion    x3days  . Sore Throat  . both ear pain     HPI: 31 y.o. year old female presents with above.   States that she has been having nasal congestion and feeling congestion and pressure behind both ears and also sore throat for about 4 days now. Says that the sore throat is actually getting worse. Says that in general all of the symptoms feel like they're getting worse and she is "feeling bad in general now". Has had no known fevers or chills.     Home Meds:   Outpatient Medications Prior to Visit  Medication Sig Dispense Refill  . cetirizine (ZYRTEC) 10 MG tablet Take 10 mg by mouth daily.      . fluticasone (FLONASE) 50 MCG/ACT nasal spray 2 sprays by Nasal route daily. #1 RFx2     . metFORMIN (GLUCOPHAGE) 1000 MG tablet Take 1 tablet (1,000 mg total) by mouth 2 (two) times daily with a meal. 60 tablet 6  . medroxyPROGESTERone (PROVERA) 10 MG tablet Take 1 tablet (10 mg total) by mouth daily. 5 tablet 0   No facility-administered medications prior to visit.     Allergies: No Known Allergies    Review of Systems: See HPI for pertinent ROS. All other ROS negative.    Physical Exam: Blood pressure 126/84, pulse 77, temperature 97.6 F (36.4 C), temperature source Oral, resp. rate 16, weight 274 lb 6.4 oz (124.5 kg), last menstrual period 08/05/2016, SpO2 98 %., Body mass index is 45.66 kg/m. General:  Obese WF. Appears in no acute distress. HEENT: Normocephalic, atraumatic, eyes without discharge, sclera non-icteric, nares are without discharge. Bilateral auditory canals clear, TM's are without perforation, pearly grey and translucent with reflective cone of light bilaterally. Oral cavity moist, posterior pharynx with mild erythema, no exudate, no peritonsillar abscess.  Neck: Supple.  No thyromegaly. No lymphadenopathy. Lungs: Clear bilaterally to auscultation without wheezes, rales, or rhonchi. Breathing is unlabored. Heart: Regular rhythm. No murmurs, rubs, or gallops. Msk:  Strength and tone normal for age. Extremities/Skin: Warm and dry.  No rashes. Neuro: Alert and oriented X 3. Moves all extremities spontaneously. Gait is normal. CNII-XII grossly in tact. Psych:  Responds to questions appropriately with a normal affect.     ASSESSMENT AND PLAN:  31 y.o. year old female with  1. Bacterial respiratory infection Rapid strep test negative. Recommend that she monitor symptoms for a couple more days and if they continue to worsen go ahead and take antibiotic and complete all of it. She states that she often gets yeast infection when takes antibiotic so is asking to have treatment available if needed for that. F/U prn. - amoxicillin (AMOXIL) 875 MG tablet; Take 1 tablet (875 mg total) by mouth 2 (two) times daily.  Dispense: 20 tablet; Refill: 0 - fluconazole (DIFLUCAN) 150 MG tablet; Take 1 tablet (150 mg total) by mouth once.  Dispense: 1 tablet; Refill: 0  2. Sore throat - STREP GROUP A AG, W/REFLEX TO CULT   Signed, [address]Mary Beth RameyDixon, GeorgiaPA, [facility] InstituteBSFM 08/22/2016 10:34 AM

## 2016-08-24 LAB — CULTURE, GROUP A STREP

## 2016-10-31 ENCOUNTER — Telehealth: Payer: Self-pay | Admitting: *Deleted

## 2016-10-31 NOTE — Telephone Encounter (Signed)
Pt called and left message stating she is now pregnant has been seeing Dr.Yalcinkaya he told her that she needs to come back to this office for care.  I called pt back and got her voicemail I left that we no longer deliver but we can see her up to 1st ultrasound for viability to call and schedule OV with provider or she can go [facility] to establish care.

## 2016-11-15 LAB — OB RESULTS CONSOLE RPR: RPR: NONREACTIVE

## 2016-11-15 LAB — OB RESULTS CONSOLE RUBELLA ANTIBODY, IGM: RUBELLA: NON-IMMUNE/NOT IMMUNE

## 2016-11-15 LAB — OB RESULTS CONSOLE GC/CHLAMYDIA
Chlamydia: NEGATIVE
GC PROBE AMP, GENITAL: NEGATIVE

## 2016-11-15 LAB — OB RESULTS CONSOLE ABO/RH: RH TYPE: POSITIVE

## 2016-11-15 LAB — OB RESULTS CONSOLE HIV ANTIBODY (ROUTINE TESTING): HIV: NONREACTIVE

## 2016-11-15 LAB — OB RESULTS CONSOLE HEPATITIS B SURFACE ANTIGEN: HEP B S AG: NEGATIVE

## 2017-04-02 NOTE — L&D Delivery Note (Signed)
Delivery Note Patient pushed well for 1 hour 50 minutes.  She had intermittent deep variable decelerations with contractions, but declined VAVD to expedite delivery. At 10:14 PM a viable female was delivered via Vaginal, Spontaneous (Presentation: OA ).  APGAR: 8, 9; weight pending .   Placenta status: Spontaneous, in tact  Cord: 3V  with the following complications: None .  Cord pH: n/a  Anesthesia:  Epidural Episiotomy:  None Lacerations:  1st degree perineal Suture Repair: 3.0 vicryl rapide Est. Blood Loss (mL):  150 mL  Mom to postpartum.  Baby to Couplet care / Skin to Skin.  Melissa Young Melissa Young 05/20/2017, 10:52 PM

## 2017-04-15 ENCOUNTER — Encounter (HOSPITAL_COMMUNITY): Payer: Self-pay | Admitting: *Deleted

## 2017-04-15 ENCOUNTER — Inpatient Hospital Stay (HOSPITAL_COMMUNITY)
Admission: AD | Admit: 2017-04-15 | Discharge: 2017-04-15 | Disposition: A | Discharge status: Home / Self Care | Payer: BC Managed Care – PPO | Source: Ambulatory Visit | Attending: Obstetrics and Gynecology | Admitting: Obstetrics and Gynecology

## 2017-04-15 DIAGNOSIS — O26893 Other specified pregnancy related conditions, third trimester: Secondary | ICD-10-CM | POA: Diagnosis not present

## 2017-04-15 DIAGNOSIS — O9989 Other specified diseases and conditions complicating pregnancy, childbirth and the puerperium: Secondary | ICD-10-CM

## 2017-04-15 DIAGNOSIS — Z3A31 31 weeks gestation of pregnancy: Secondary | ICD-10-CM | POA: Diagnosis not present

## 2017-04-15 DIAGNOSIS — R030 Elevated blood-pressure reading, without diagnosis of hypertension: Secondary | ICD-10-CM | POA: Diagnosis present

## 2017-04-15 LAB — COMPREHENSIVE METABOLIC PANEL
ALBUMIN: 3.1 g/dL — AB (ref 3.5–5.0)
ALT: 18 U/L (ref 14–54)
ANION GAP: 8 (ref 5–15)
AST: 18 U/L (ref 15–41)
Alkaline Phosphatase: 75 U/L (ref 38–126)
BUN: 8 mg/dL (ref 6–20)
CHLORIDE: 105 mmol/L (ref 101–111)
CO2: 21 mmol/L — AB (ref 22–32)
Calcium: 9 mg/dL (ref 8.9–10.3)
Creatinine, Ser: 0.51 mg/dL (ref 0.44–1.00)
GFR calc Af Amer: >60 mL/min (ref >60)
GFR calc non Af Amer: >60 mL/min (ref >60)
GLUCOSE: 92 mg/dL (ref 65–99)
Potassium: 4.1 mmol/L (ref 3.5–5.1)
SODIUM: 134 mmol/L — AB (ref 135–145)
Total Bilirubin: 0.5 mg/dL (ref 0.3–1.2)
Total Protein: 6.7 g/dL (ref 6.5–8.1)

## 2017-04-15 LAB — CBC
HCT: 36.5 % (ref 36.0–46.0)
HEMOGLOBIN: 12.4 g/dL (ref 12.0–15.0)
MCH: 30 pg (ref 26.0–34.0)
MCHC: 34 g/dL (ref 30.0–36.0)
MCV: 88.2 fL (ref 78.0–100.0)
PLATELETS: 313 10*3/uL (ref 150–400)
RBC: 4.14 MIL/uL (ref 3.87–5.11)
RDW: 13.3 % (ref 11.5–15.5)
WBC: 12 10*3/uL — ABNORMAL HIGH (ref 4.0–10.5)

## 2017-04-15 LAB — LACTATE DEHYDROGENASE: LDH: 113 U/L (ref 98–192)

## 2017-04-15 LAB — URINALYSIS, ROUTINE W REFLEX MICROSCOPIC
Bilirubin Urine: NEGATIVE
Glucose, UA: NEGATIVE mg/dL
Hgb urine dipstick: NEGATIVE
KETONES UR: NEGATIVE mg/dL
Nitrite: NEGATIVE
PH: 5 (ref 5.0–8.0)
PROTEIN: NEGATIVE mg/dL
Specific Gravity, Urine: 1.005 (ref 1.005–1.030)

## 2017-04-15 LAB — PROTEIN / CREATININE RATIO, URINE: CREATININE, URINE: 34 mg/dL

## 2017-04-15 LAB — URIC ACID: Uric Acid, Serum: 3.7 mg/dL (ref 2.3–6.6)

## 2017-04-15 NOTE — MAU Note (Signed)
Pt reports she was at work today started feeling dizzy. 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

## 2017-04-15 NOTE — Discharge Instructions (Signed)
Hypertension During Pregnancy °Hypertension, commonly called high blood pressure, is when the force of blood pumping through your arteries is too strong. Arteries are blood vessels that carry blood from the heart throughout the body. Hypertension during pregnancy can cause problems for you and your baby. Your baby may be born early (prematurely) or may not weigh as much as he or she should at birth. Very bad cases of hypertension during pregnancy can be life-threatening. °Different types of hypertension can occur during pregnancy. These include: °· Chronic hypertension. This happens when: °? You have hypertension before pregnancy and it continues during pregnancy. °? You develop hypertension before you are [redacted] weeks pregnant, and it continues during pregnancy. °· Gestational hypertension. This is hypertension that develops after the 20th week of pregnancy. °· Preeclampsia, also called toxemia of pregnancy. This is a very serious type of hypertension that develops only during pregnancy. It affects the whole body, and it can be very dangerous for you and your baby. ° °Gestational hypertension and preeclampsia usually go away within 6 weeks after your baby is born. Women who have hypertension during pregnancy have a greater chance of developing hypertension later in life or during future pregnancies. °What are the causes? °The exact cause of hypertension is not known. °What increases the risk? °There are certain factors that make it more likely for you to develop hypertension during pregnancy. These include: °· Having hypertension during a previous pregnancy or prior to pregnancy. °· Being overweight. °· Being older than age 40. °· Being pregnant for the first time or being pregnant with more than one baby. °· Becoming pregnant using fertilization methods such as IVF (in vitro fertilization). °· Having diabetes, kidney problems, or systemic lupus erythematosus. °· Having a family history of hypertension. ° °What are the  signs or symptoms? °Chronic hypertension and gestational hypertension rarely cause symptoms. Preeclampsia causes symptoms, which may include: °· Increased protein in your urine. Your health care provider will check for this at every visit before you give birth (prenatal visit). °· Severe headaches. °· Sudden weight gain. °· Swelling of the hands, face, legs, and feet. °· Nausea and vomiting. °· Vision problems, such as blurred or double vision. °· Numbness in the face, arms, legs, and feet. °· Dizziness. °· Slurred speech. °· Sensitivity to bright lights. °· Abdominal pain. °· Convulsions. ° °How is this diagnosed? °You may be diagnosed with hypertension during a routine prenatal exam. At each prenatal visit, you may: °· Have a urine test to check for high amounts of protein in your urine. °· Have your blood pressure checked. A blood pressure reading is recorded as two numbers, such as "120 over 80" (or 120/80). The first ("top") number is called the systolic pressure. It is a measure of the pressure in your arteries when your heart beats. The second ("bottom") number is called the diastolic pressure. It is a measure of the pressure in your arteries as your heart relaxes between beats. Blood pressure is measured in a unit called mm Hg. A normal blood pressure reading is: °? Systolic: below 120. °? Diastolic: below 80. ° °The type of hypertension that you are diagnosed with depends on your test results and when your symptoms developed. °· Chronic hypertension is usually diagnosed before 20 weeks of pregnancy. °· Gestational hypertension is usually diagnosed after 20 weeks of pregnancy. °· Hypertension with high amounts of protein in the urine is diagnosed as preeclampsia. °· Blood pressure measurements that stay above 160 systolic, or above 110 diastolic, are   signs of severe preeclampsia. ° °How is this treated? °Treatment for hypertension during pregnancy varies depending on the type of hypertension you have and how  serious it is. °· If you take medicines called ACE inhibitors to treat chronic hypertension, you may need to switch medicines. ACE inhibitors should not be taken during pregnancy. °· If you have gestational hypertension, you may need to take blood pressure medicine. °· If you are at risk for preeclampsia, your health care provider may recommend that you take a low-dose aspirin every day to prevent high blood pressure during your pregnancy. °· If you have severe preeclampsia, you may need to be hospitalized so you and your baby can be monitored closely. You may also need to take medicine (magnesium sulfate) to prevent seizures and to lower blood pressure. This medicine may be given as an injection or through an IV tube. °· In some cases, if your condition gets worse, you may need to deliver your baby early. ° °Follow these instructions at home: °Eating and drinking °· Drink enough fluid to keep your urine clear or pale yellow. °· Eat a healthy diet that is low in salt (sodium). Do not add salt to your food. Check food labels to see how much sodium a food or beverage contains. °Lifestyle °· Do not use any products that contain nicotine or tobacco, such as cigarettes and e-cigarettes. If you need help quitting, ask your health care provider. °· Do not use alcohol. °· Avoid caffeine. °· Avoid stress as much as possible. Rest and get plenty of sleep. °General instructions °· Take over-the-counter and prescription medicines only as told by your health care provider. °· While lying down, lie on your left side. This keeps pressure off your baby. °· While sitting or lying down, raise (elevate) your feet. Try putting some pillows under your lower legs. °· Exercise regularly. Ask your health care provider what kinds of exercise are best for you. °· Keep all prenatal and follow-up visits as told by your health care provider. This is important. °Contact a health care provider if: °· You have symptoms that your health care  provider told you may require more treatment or monitoring, such as: °? Fever. °? Vomiting. °? Headache. °Get help right away if: °· You have severe abdominal pain or vomiting that does not get better with treatment. °· You suddenly develop swelling in your hands, ankles, or face. °· You gain 4 lbs (1.8 kg) or more in 1 week. °· You develop vaginal bleeding, or you have blood in your urine. °· You do not feel your baby moving as much as usual. °· You have blurred or double vision. °· You have muscle twitching or sudden tightening (spasms). °· You have shortness of breath. °· Your lips or fingernails turn blue. °This information is not intended to replace advice given to you by your health care provider. Make sure you discuss any questions you have with your health care provider. °Document Released: 12/05/2010 Document Revised: 10/07/2015 Document Reviewed: 09/02/2015 °Elsevier Interactive Patient Education © 2018 [employer] ° °

## 2017-04-15 NOTE — MAU Provider Note (Signed)
History     CSN: [id number]  Arrival date and time: 04/15/17 1627   First Provider Initiated Contact with Patient 04/15/17 1707      Chief Complaint  Patient presents with  . Hypertension  . Dizziness   HPI  Melissa Young is a 32 y.o. G1P0000 at [redacted]w[redacted]d who presents for BP evaluation. Denies hx of hypertension. 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 Denies headache, visual disturbance, or epigastric pain. Denies abdominal pain, vaginal bleeding, or LOF. Positive fetal movement.  OB History    Gravida Para Term Preterm AB Living   1 0 0 0 0 0   SAB TAB Ectopic Multiple Live Births   0 0 0 0        Past Medical History:  Diagnosis Date  . ADHD (attention deficit hyperactivity disorder)   . Anxiety   . Asthma   . Depression   . Seasonal allergies     Past Surgical History:  Procedure Laterality Date  . NO PAST SURGERIES      Family History  Problem Relation Age of Onset  . Alcohol abuse Maternal Grandmother   . Anxiety disorder Maternal Grandmother   . Anxiety disorder Mother   . Drug abuse Mother   . Drug abuse Father     Social History   Tobacco Use  . Smoking status: Never Smoker  . Smokeless tobacco: Never Used  Substance Use Topics  . Alcohol use: No    Alcohol/week: 0.0 oz    Comment: occasionally  . Drug use: No    Allergies: No Known Allergies  Medications Prior to Admission  Medication Sig Dispense Refill Last Dose  . amoxicillin (AMOXIL) 875 MG tablet Take 1 tablet (875 mg total) by mouth 2 (two) times daily. 20 tablet 0   . cetirizine (ZYRTEC) 10 MG tablet Take 10 mg by mouth daily.     Taking  . dexamethasone (DECADRON) 0.5 MG tablet Take 0.5 mg by mouth daily.   Taking  . fluticasone (FLONASE) 50 MCG/ACT nasal spray 2 sprays by Nasal route daily. #1 RFx2    Taking  . letrozole (FEMARA) 2.5 MG tablet Take 2.5 mg by mouth as needed.  3  Taking  . metFORMIN (GLUCOPHAGE) 1000 MG tablet Take 1 tablet (1,000 mg total) by mouth 2 (two) times daily with a meal. 60 tablet 6 Taking    Review of Systems  Constitutional: Negative.   Eyes: Negative for visual disturbance.  Gastrointestinal: Negative.   Genitourinary: Negative.   Neurological: Positive for dizziness (isolated episode of dizziness earlier today that has not continued). Negative for headaches.   Physical Exam   Blood pressure 134/85, pulse 95, temperature 98.3 F (36.8 C), last menstrual period 08/05/2016. Patient Vitals for the past 24 hrs:  BP Temp Pulse  04/15/17 1801 130/82 - 84  04/15/17 1746 (!) 141/68 - 82  04/15/17 1731 (!) 149/72 - 88  04/15/17 1716 (!) 145/60 - 90  04/15/17 1701 134/85 - 95  04/15/17 1700 - 98.3 F (36.8 C) -  04/15/17 1657 (!) 148/85 - 91    Physical Exam  Nursing note and vitals reviewed. Constitutional: She is oriented to person, place, and time. She appears well-developed and well-nourished. No distress.  HENT:  Head: Normocephalic and atraumatic.  Eyes: Conjunctivae are normal. Right eye exhibits no discharge. Left eye exhibits no discharge. No scleral icterus.  Neck: Normal range of motion.  Cardiovascular: Normal rate, regular rhythm and normal heart sounds.  No murmur heard. Respiratory: Effort normal and breath sounds normal. No respiratory distress. She has no wheezes.  GI: Soft. Bowel sounds are normal. There is no tenderness.  Neurological: She is alert and oriented to person, place, and time. She has normal reflexes.  No clonus  Skin: Skin is warm and dry. She is not diaphoretic.  Psychiatric: She has a normal mood and affect. Her behavior is normal. Judgment and thought content normal.    MAU Course  Procedures Results for orders placed or performed during the hospital encounter of 04/15/17 (from the past 24 hour(s))  Urinalysis, Routine w reflex microscopic     Status: Abnormal   Collection Time: 04/15/17   4:33 PM  Result Value Ref Range   Color, Urine YELLOW YELLOW   APPearance HAZY (A) CLEAR   Specific Gravity, Urine 1.005 1.005 - 1.030   pH 5.0 5.0 - 8.0   Glucose, UA NEGATIVE NEGATIVE mg/dL   Hgb urine dipstick NEGATIVE NEGATIVE   Bilirubin Urine NEGATIVE NEGATIVE   Ketones, ur NEGATIVE NEGATIVE mg/dL   Protein, ur NEGATIVE NEGATIVE mg/dL   Nitrite NEGATIVE NEGATIVE   Leukocytes, UA MODERATE (A) NEGATIVE   RBC / HPF 0-5 0 - 5 RBC/hpf   WBC, UA 6-30 0 - 5 WBC/hpf   Bacteria, UA RARE (A) NONE SEEN   Squamous Epithelial / LPF 6-30 (A) NONE SEEN   Mucus PRESENT   Protein / creatinine ratio, urine     Status: None   Collection Time: 04/15/17  4:33 PM  Result Value Ref Range   Creatinine, Urine 34.00 mg/dL   Total Protein, Urine <6 mg/dL   Protein Creatinine Ratio        0.00 - 0.15 mg/mg[Cre]  CBC     Status: Abnormal   Collection Time: 04/15/17  5:09 PM  Result Value Ref Range   WBC 12.0 (H) 4.0 - 10.5 K/uL   RBC 4.14 3.87 - 5.11 MIL/uL   Hemoglobin 12.4 12.0 - 15.0 g/dL   HCT 16.1 09.6 - 04.5 %   MCV 88.2 78.0 - 100.0 fL   MCH 30.0 26.0 - 34.0 pg   MCHC 34.0 30.0 - 36.0 g/dL   RDW 40.9 81.1 - 91.4 %   Platelets 313 150 - 400 K/uL  Comprehensive metabolic panel     Status: Abnormal   Collection Time: 04/15/17  5:09 PM  Result Value Ref Range   Sodium 134 (L) 135 - 145 mmol/L   Potassium 4.1 3.5 - 5.1 mmol/L   Chloride 105 101 - 111 mmol/L   CO2 21 (L) 22 - 32 mmol/L   Glucose, Bld 92 65 - 99 mg/dL   BUN 8 6 - 20 mg/dL   Creatinine, Ser 7.82 0.44 - 1.00 mg/dL   Calcium 9.0 8.9 - 95.6 mg/dL   Total Protein 6.7 6.5 - 8.1 g/dL   Albumin 3.1 (L) 3.5 - 5.0 g/dL   AST 18 15 - 41 U/L   ALT 18 14 - 54 U/L   Alkaline Phosphatase 75 38 - 126 U/L   Total Bilirubin 0.5 0.3 - 1.2 mg/dL   GFR calc non Af Amer >60 >60 mL/min   GFR calc Af Amer >60 >60 mL/min   Anion gap 8 5 - 15  Uric acid     Status: None   Collection Time: 04/15/17  5:09 PM  Result Value Ref Range  Uric  Acid, Serum 3.7 2.3 - 6.6 mg/dL  Lactate dehydrogenase     Status: None   Collection Time: 04/15/17  5:09 PM  Result Value Ref Range   LDH 113 98 - 192 U/L    MDM NST:  Baseline: 140 bpm, Variability: Good {> 6 bpm), Accelerations: Reactive and Decelerations: Absent Cycle BP & labs (CBC, CMP, uric acid, LDH, urine PCR) No severe range BPs & pt asymptomatic C/w Dr. Claiborne Billingsallahan. Ok to discharge home. Pt to schedule BP check in the office last this week.   Assessment and Plan  A:  1. Elevated BP without diagnosis of hypertension   2. [redacted] weeks gestation of pregnancy    P: Discharge home Discussed reasons to return to MAU including s/s preeclampsia Call office in morning to schedule BP check before the weekend  Judeth Hornrin E[place] Valdes 04/15/2017, 5:29 PM

## 2017-04-22 ENCOUNTER — Inpatient Hospital Stay (HOSPITAL_COMMUNITY): Payer: BC Managed Care – PPO

## 2017-04-22 ENCOUNTER — Inpatient Hospital Stay (HOSPITAL_COMMUNITY)
Admission: AD | Admit: 2017-04-22 | Discharge: 2017-04-22 | Disposition: A | Discharge status: Home / Self Care | Payer: BC Managed Care – PPO | Source: Ambulatory Visit | Attending: Obstetrics | Admitting: Obstetrics

## 2017-04-22 ENCOUNTER — Encounter (HOSPITAL_COMMUNITY): Payer: Self-pay | Admitting: *Deleted

## 2017-04-22 DIAGNOSIS — J45909 Unspecified asthma, uncomplicated: Secondary | ICD-10-CM | POA: Insufficient documentation

## 2017-04-22 DIAGNOSIS — Z79899 Other long term (current) drug therapy: Secondary | ICD-10-CM | POA: Diagnosis not present

## 2017-04-22 DIAGNOSIS — O99513 Diseases of the respiratory system complicating pregnancy, third trimester: Secondary | ICD-10-CM | POA: Insufficient documentation

## 2017-04-22 DIAGNOSIS — F329 Major depressive disorder, single episode, unspecified: Secondary | ICD-10-CM | POA: Diagnosis not present

## 2017-04-22 DIAGNOSIS — O288 Other abnormal findings on antenatal screening of mother: Secondary | ICD-10-CM | POA: Diagnosis not present

## 2017-04-22 DIAGNOSIS — O133 Gestational [pregnancy-induced] hypertension without significant proteinuria, third trimester: Secondary | ICD-10-CM | POA: Diagnosis not present

## 2017-04-22 DIAGNOSIS — F909 Attention-deficit hyperactivity disorder, unspecified type: Secondary | ICD-10-CM | POA: Diagnosis not present

## 2017-04-22 DIAGNOSIS — F419 Anxiety disorder, unspecified: Secondary | ICD-10-CM | POA: Insufficient documentation

## 2017-04-22 DIAGNOSIS — O99343 Other mental disorders complicating pregnancy, third trimester: Secondary | ICD-10-CM | POA: Insufficient documentation

## 2017-04-22 DIAGNOSIS — Z3A32 32 weeks gestation of pregnancy: Secondary | ICD-10-CM | POA: Diagnosis not present

## 2017-04-22 HISTORY — DX: Gestational (pregnancy-induced) hypertension without significant proteinuria, unspecified trimester: O13.9

## 2017-04-22 NOTE — MAU Note (Signed)
Urine in lab 

## 2017-04-22 NOTE — MAU Note (Signed)
Pt in office today for routine monitoring and they were unable to get a reactive NST, sent over for monitoring and ultrasound

## 2017-04-22 NOTE — MAU Provider Note (Signed)
History     CSN: [id number]  Arrival date and time: 04/22/17 1232  Chief Complaint  Patient presents with  . non reactive nst   HPI Melissa Young is a 32 y.o. G1P0000 at [redacted]w[redacted]d who presents from the office for a BPP. She was seen for routine antenatal testing in the office due to [facility]. She had a non reactive NST while in the office. She reports good fetal movement. Denies leaking or bleeding.  OB History    Gravida Para Term Preterm AB Living   1 0 0 0 0 0   SAB TAB Ectopic Multiple Live Births   0 0 0 0        Past Medical History:  Diagnosis Date  . ADHD (attention deficit hyperactivity disorder)   . Anxiety   . Asthma   . Depression   . Pregnancy induced hypertension   . Seasonal allergies     Past Surgical History:  Procedure Laterality Date  . NO PAST SURGERIES      Family History  Problem Relation Age of Onset  . Alcohol abuse Maternal Grandmother   . Anxiety disorder Maternal Grandmother   . Anxiety disorder Mother   . Drug abuse Mother   . Drug abuse Father     Social History   Tobacco Use  . Smoking status: Never Smoker  . Smokeless tobacco: Never Used  Substance Use Topics  . Alcohol use: No    Alcohol/week: 0.0 oz    Comment: occasionally  . Drug use: No    Allergies: No Known Allergies  Medications Prior to Admission  Medication Sig Dispense Refill Last Dose  . Prenatal Vit-Fe Fumarate-FA (PRENATAL MULTIVITAMIN) TABS tablet Take 1 tablet by mouth daily at 12 noon.   04/21/2017 at Unknown time    Review of Systems  Constitutional: Negative.  Negative for fatigue and fever.  HENT: Negative.   Respiratory: Negative.  Negative for shortness of breath.   Cardiovascular: Negative.  Negative for chest pain.  Gastrointestinal: Negative.  Negative for abdominal pain, constipation, diarrhea, nausea and vomiting.  Genitourinary: Negative.  Negative for dysuria.  Neurological: Negative.  Negative for dizziness and headaches.   Physical Exam    Blood pressure 130/67, pulse 78, temperature 98.9 F (37.2 C), temperature source Oral, resp. rate 18, height 5\' 5"  (1.651 m), weight 207 lb (93.9 kg), last menstrual period 08/05/2016, SpO2 99 %.  Physical Exam  Nursing note and vitals reviewed. Constitutional: She is oriented to person, place, and time. She appears well-developed and well-nourished. No distress.  HENT:  Head: Normocephalic.  Eyes: Pupils are equal, round, and reactive to light.  Cardiovascular: Normal rate, regular rhythm and normal heart sounds.  Respiratory: Effort normal and breath sounds normal. No respiratory distress.  GI: Soft. Bowel sounds are normal. She exhibits no distension. There is no tenderness.  Neurological: She is alert and oriented to person, place, and time.  Skin: Skin is warm and dry.  Psychiatric: She has a normal mood and affect. Her behavior is normal. Judgment and thought content normal.   Fetal Tracing:  Baseline: 140 Variability: moderate  Accels: 10x10 Decels: none  Toco: none  MAU Course  Procedures  MDM NST BPP- 6/8, points off for movement  Reviewed with Dr. Chestine Spore- will come see patient to talk about the plan of care Patient to be discharged home and follow up in the office tomorrow at 1130 am for repeat BPP  Assessment and Plan   1. Non-reactive NST (non-stress test)  2. [redacted] weeks gestation of pregnancy   3. Gestational hypertension, third trimester    -Discharge home in stable condition -Fetal kick count precautions discussed -[facility] -Patient may return to MAU as needed or if her condition were to change or worsen  Rolm BookbinderCaroline M Ioannis Schuh CNM 04/22/2017, 2:42 PM

## 2017-04-22 NOTE — Discharge Instructions (Signed)

## 2017-05-13 LAB — OB RESULTS CONSOLE GBS: GBS: NEGATIVE

## 2017-05-16 ENCOUNTER — Telehealth (HOSPITAL_COMMUNITY): Payer: Self-pay | Admitting: *Deleted

## 2017-05-16 ENCOUNTER — Encounter (HOSPITAL_COMMUNITY): Payer: Self-pay | Admitting: *Deleted

## 2017-05-16 NOTE — Telephone Encounter (Signed)
Preadmission screen  

## 2017-05-20 ENCOUNTER — Inpatient Hospital Stay (HOSPITAL_COMMUNITY): Payer: BC Managed Care – PPO | Admitting: Anesthesiology

## 2017-05-20 ENCOUNTER — Encounter (HOSPITAL_COMMUNITY): Payer: Self-pay | Admitting: *Deleted

## 2017-05-20 ENCOUNTER — Inpatient Hospital Stay (HOSPITAL_COMMUNITY)
Admission: AD | Admit: 2017-05-20 | Discharge: 2017-05-22 | DRG: 807 | Disposition: A | Discharge status: Home / Self Care | Payer: BC Managed Care – PPO | Source: Ambulatory Visit | Attending: Obstetrics | Admitting: Obstetrics

## 2017-05-20 ENCOUNTER — Other Ambulatory Visit: Payer: Self-pay

## 2017-05-20 DIAGNOSIS — Z3A36 36 weeks gestation of pregnancy: Secondary | ICD-10-CM

## 2017-05-20 DIAGNOSIS — O9962 Diseases of the digestive system complicating childbirth: Secondary | ICD-10-CM | POA: Diagnosis present

## 2017-05-20 DIAGNOSIS — D649 Anemia, unspecified: Secondary | ICD-10-CM | POA: Diagnosis present

## 2017-05-20 DIAGNOSIS — O9902 Anemia complicating childbirth: Secondary | ICD-10-CM | POA: Diagnosis present

## 2017-05-20 DIAGNOSIS — O99214 Obesity complicating childbirth: Secondary | ICD-10-CM | POA: Diagnosis present

## 2017-05-20 DIAGNOSIS — O134 Gestational [pregnancy-induced] hypertension without significant proteinuria, complicating childbirth: Secondary | ICD-10-CM | POA: Diagnosis present

## 2017-05-20 DIAGNOSIS — K219 Gastro-esophageal reflux disease without esophagitis: Secondary | ICD-10-CM | POA: Diagnosis present

## 2017-05-20 DIAGNOSIS — O42913 Preterm premature rupture of membranes, unspecified as to length of time between rupture and onset of labor, third trimester: Secondary | ICD-10-CM | POA: Diagnosis present

## 2017-05-20 DIAGNOSIS — O42919 Preterm premature rupture of membranes, unspecified as to length of time between rupture and onset of labor, unspecified trimester: Secondary | ICD-10-CM | POA: Diagnosis present

## 2017-05-20 LAB — COMPREHENSIVE METABOLIC PANEL
ALK PHOS: 100 U/L (ref 38–126)
ALT: 16 U/L (ref 14–54)
ANION GAP: 7 (ref 5–15)
AST: 22 U/L (ref 15–41)
Albumin: 2.9 g/dL — ABNORMAL LOW (ref 3.5–5.0)
BILIRUBIN TOTAL: 0.7 mg/dL (ref 0.3–1.2)
BUN: 7 mg/dL (ref 6–20)
CO2: 21 mmol/L — ABNORMAL LOW (ref 22–32)
Calcium: 8.8 mg/dL — ABNORMAL LOW (ref 8.9–10.3)
Chloride: 104 mmol/L (ref 101–111)
Creatinine, Ser: 0.54 mg/dL (ref 0.44–1.00)
GLUCOSE: 103 mg/dL — AB (ref 65–99)
Potassium: 4.2 mmol/L (ref 3.5–5.1)
Sodium: 132 mmol/L — ABNORMAL LOW (ref 135–145)
TOTAL PROTEIN: 6.5 g/dL (ref 6.5–8.1)

## 2017-05-20 LAB — CBC
HCT: 39.3 % (ref 36.0–46.0)
Hemoglobin: 13.5 g/dL (ref 12.0–15.0)
MCH: 30.4 pg (ref 26.0–34.0)
MCHC: 34.4 g/dL (ref 30.0–36.0)
MCV: 88.5 fL (ref 78.0–100.0)
PLATELETS: 332 10*3/uL (ref 150–400)
RBC: 4.44 MIL/uL (ref 3.87–5.11)
RDW: 13.6 % (ref 11.5–15.5)
WBC: 13.2 10*3/uL — ABNORMAL HIGH (ref 4.0–10.5)

## 2017-05-20 LAB — TYPE AND SCREEN
ABO/RH(D): A POS
Antibody Screen: NEGATIVE

## 2017-05-20 LAB — POCT FERN TEST: POCT Fern Test: POSITIVE

## 2017-05-20 LAB — ABO/RH: ABO/RH(D): A POS

## 2017-05-20 MED ORDER — OXYTOCIN 40 UNITS IN LACTATED RINGERS INFUSION - SIMPLE MED
1.0000 m[IU]/min | INTRAVENOUS | Status: DC
  Administered 2017-05-20: 4 m[IU]/min via INTRAVENOUS
  Administered 2017-05-20 (×2): 2 m[IU]/min via INTRAVENOUS
  Filled 2017-05-20: qty 1000

## 2017-05-20 MED ORDER — OXYTOCIN 40 UNITS IN LACTATED RINGERS INFUSION - SIMPLE MED: 2.5000 [IU]/h | INTRAVENOUS | Status: DC

## 2017-05-20 MED ORDER — EPHEDRINE 5 MG/ML INJ
10.0000 mg | INTRAVENOUS | Status: DC | PRN
  Administered 2017-05-20: 10 mg via INTRAVENOUS
  Filled 2017-05-20: qty 2

## 2017-05-20 MED ORDER — PHENYLEPHRINE 40 MCG/ML (10ML) SYRINGE FOR IV PUSH (FOR BLOOD PRESSURE SUPPORT)
80.0000 ug | PREFILLED_SYRINGE | INTRAVENOUS | Status: DC | PRN
Start: 2017-05-20 — End: 2017-05-21
  Filled 2017-05-20: qty 5
  Filled 2017-05-20: qty 10

## 2017-05-20 MED ORDER — LACTATED RINGERS IV SOLN
INTRAVENOUS | Status: DC
  Administered 2017-05-20 (×2): via INTRAVENOUS

## 2017-05-20 MED ORDER — TERBUTALINE SULFATE 1 MG/ML IJ SOLN
0.2500 mg | Freq: Once | INTRAMUSCULAR | Status: DC | PRN
  Filled 2017-05-20: qty 1

## 2017-05-20 MED ORDER — DIPHENHYDRAMINE HCL 50 MG/ML IJ SOLN: 12.5000 mg | INTRAMUSCULAR | Status: DC | PRN

## 2017-05-20 MED ORDER — SOD CITRATE-CITRIC ACID 500-334 MG/5ML PO SOLN: 30.0000 mL | ORAL | Status: DC | PRN

## 2017-05-20 MED ORDER — ONDANSETRON HCL 4 MG/2ML IJ SOLN
4.0000 mg | Freq: Four times a day (QID) | INTRAMUSCULAR | Status: DC | PRN
  Administered 2017-05-20: 4 mg via INTRAVENOUS
  Filled 2017-05-20: qty 2

## 2017-05-20 MED ORDER — LACTATED RINGERS IV SOLN
500.0000 mL | Freq: Once | INTRAVENOUS | Status: AC
  Administered 2017-05-20: 300 mL via INTRAVENOUS

## 2017-05-20 MED ORDER — FENTANYL CITRATE (PF) 100 MCG/2ML IJ SOLN: 50.0000 ug | INTRAMUSCULAR | Status: DC | PRN

## 2017-05-20 MED ORDER — FENTANYL 2.5 MCG/ML BUPIVACAINE 1/10 % EPIDURAL INFUSION (WH - ANES)
14.0000 mL/h | INTRAMUSCULAR | Status: DC | PRN
  Administered 2017-05-20 (×2): 14 mL/h via EPIDURAL
  Filled 2017-05-20 (×2): qty 100

## 2017-05-20 MED ORDER — OXYCODONE-ACETAMINOPHEN 5-325 MG PO TABS: 1.0000 | ORAL_TABLET | ORAL | Status: DC | PRN

## 2017-05-20 MED ORDER — OXYTOCIN BOLUS FROM INFUSION
500.0000 mL | Freq: Once | INTRAVENOUS | Status: AC
  Administered 2017-05-20: 500 mL via INTRAVENOUS

## 2017-05-20 MED ORDER — LIDOCAINE HCL (PF) 1 % IJ SOLN
30.0000 mL | INTRAMUSCULAR | Status: DC | PRN
  Filled 2017-05-20: qty 30

## 2017-05-20 MED ORDER — LACTATED RINGERS IV SOLN: 500.0000 mL | INTRAVENOUS | Status: DC | PRN

## 2017-05-20 MED ORDER — LACTATED RINGERS IV SOLN: 500.0000 mL | Freq: Once | INTRAVENOUS | Status: DC

## 2017-05-20 MED ORDER — BETAMETHASONE SOD PHOS & ACET 6 (3-3) MG/ML IJ SUSP
12.0000 mg | INTRAMUSCULAR | Status: DC
  Administered 2017-05-20: 12 mg via INTRAMUSCULAR
  Filled 2017-05-20 (×2): qty 2

## 2017-05-20 MED ORDER — LIDOCAINE HCL (PF) 1 % IJ SOLN
INTRAMUSCULAR | Status: DC | PRN
  Administered 2017-05-20 (×2): 4 mL via EPIDURAL

## 2017-05-20 MED ORDER — PHENYLEPHRINE 40 MCG/ML (10ML) SYRINGE FOR IV PUSH (FOR BLOOD PRESSURE SUPPORT)
80.0000 ug | PREFILLED_SYRINGE | INTRAVENOUS | Status: AC | PRN
  Administered 2017-05-20 (×3): 80 ug via INTRAVENOUS
  Filled 2017-05-20: qty 10

## 2017-05-20 MED ORDER — OXYCODONE-ACETAMINOPHEN 5-325 MG PO TABS: 2.0000 | ORAL_TABLET | ORAL | Status: DC | PRN

## 2017-05-20 MED ORDER — EPHEDRINE 5 MG/ML INJ
10.0000 mg | INTRAVENOUS | Status: DC | PRN
  Filled 2017-05-20: qty 2
  Filled 2017-05-20: qty 4

## 2017-05-20 MED ORDER — ACETAMINOPHEN 325 MG PO TABS: 650.0000 mg | ORAL_TABLET | ORAL | Status: DC | PRN

## 2017-05-20 NOTE — Progress Notes (Signed)
Patient increasingly uncomfortable  BP (!) 148/76   Pulse 67   Temp 98.4 F (36.9 C) (Oral)   Resp 19   Ht 5\' 5"  (1.651 m)   Wt (!) 136.5 kg (301 lb)   LMP 08/05/2016 (Approximate)   BMI 50.09 kg/m   Toco: q2-3 min EF: 140s, mod var, + accels SVE: 5/100/-2  A&P: g1 @ 8230w4d w PPROM / labor Cont pitocin augmentation Epidural now Fsr/vtx/gbs neg

## 2017-05-20 NOTE — Anesthesia Preprocedure Evaluation (Signed)
Anesthesia Evaluation  Patient identified by MRN, date of birth, ID band Patient awake    Reviewed: Allergy & Precautions, Patient's Chart, lab work & pertinent test results  Airway Mallampati: III  TM Distance: >3 FB Neck ROM: Full    Dental no notable dental hx. (+) Teeth Intact   Pulmonary asthma ,    Pulmonary exam normal breath sounds clear to auscultation       Cardiovascular hypertension, Normal cardiovascular exam Rhythm:Regular Rate:Normal     Neuro/Psych PSYCHIATRIC DISORDERS Anxiety Depression ADHDnegative neurological ROS     GI/Hepatic Neg liver ROS, GERD  Medicated and Controlled,  Endo/Other  Morbid obesity  Renal/GU negative Renal ROS  negative genitourinary   Musculoskeletal negative musculoskeletal ROS (+)   Abdominal (+) + obese,   Peds  Hematology  (+) anemia ,   Anesthesia Other Findings   Reproductive/Obstetrics (+) Pregnancy PIH                             Anesthesia Physical Anesthesia Plan  ASA: III  Anesthesia Plan: Epidural   Post-op Pain Management:    Induction:   PONV Risk Score and Plan:   Airway Management Planned: Natural Airway  Additional Equipment:   Intra-op Plan:   Post-operative Plan:   Informed Consent: I have reviewed the patients History and Physical, chart, labs and discussed the procedure including the risks, benefits and alternatives for the proposed anesthesia with the patient or authorized representative who has indicated his/her understanding and acceptance.     Plan Discussed with: Anesthesiologist  Anesthesia Plan Comments:         Anesthesia Quick Evaluation

## 2017-05-20 NOTE — MAU Note (Signed)
Pt reports ? ROM at 0600, some contractions
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
## 2017-05-20 NOTE — MAU Note (Signed)
Urine sent to lab 

## 2017-05-20 NOTE — Anesthesia Procedure Notes (Signed)
Epidural Patient location during procedure: OB Start time: 05/20/2017 1:08 PM  Staffing Anesthesiologist: Mal AmabileFoster, Zakhai Meisinger, MD Performed: anesthesiologist   Preanesthetic Checklist Completed: patient identified, site marked, surgical consent, pre-op evaluation, timeout performed, IV checked, risks and benefits discussed and monitors and equipment checked  Epidural Patient position: sitting Prep: site prepped and draped and DuraPrep Patient monitoring: continuous pulse ox and blood pressure Approach: midline Location: L3-L4 Injection technique: LOR air  Needle:  Needle type: Tuohy  Needle gauge: 17 G Needle length: 9 cm and 9 Needle insertion depth: 8 cm Catheter type: closed end flexible Catheter size: 19 Gauge Catheter at skin depth: 13 cm Test dose: negative and Other  Assessment Events: blood not aspirated, injection not painful, no injection resistance, negative IV test and no paresthesia  Additional Notes Patient identified. Risks and benefits discussed including failed block, incomplete  Pain control, post dural puncture headache, nerve damage, paralysis, blood pressure Changes, nausea, vomiting, reactions to medications-both toxic and allergic and post Partum back pain. All questions were answered. Patient expressed understanding and wished to proceed. Sterile technique was used throughout procedure. Epidural site was Dressed with sterile barrier dressing. No paresthesias, signs of intravascular injection Or signs of intrathecal spread were encountered.  Patient was more comfortable after the epidural was dosed. Please see RN's note for documentation of vital signs and FHR which are stable.

## 2017-05-21 LAB — CBC
HCT: 35.9 % — ABNORMAL LOW (ref 36.0–46.0)
HEMATOCRIT: 33.8 % — AB (ref 36.0–46.0)
HEMOGLOBIN: 12.7 g/dL (ref 12.0–15.0)
Hemoglobin: 11.7 g/dL — ABNORMAL LOW (ref 12.0–15.0)
MCH: 30.4 pg (ref 26.0–34.0)
MCH: 30.9 pg (ref 26.0–34.0)
MCHC: 34.6 g/dL (ref 30.0–36.0)
MCHC: 35.4 g/dL (ref 30.0–36.0)
MCV: 87.3 fL (ref 78.0–100.0)
MCV: 87.8 fL (ref 78.0–100.0)
Platelets: 298 10*3/uL (ref 150–400)
Platelets: 316 10*3/uL (ref 150–400)
RBC: 3.85 MIL/uL — AB (ref 3.87–5.11)
RBC: 4.11 MIL/uL (ref 3.87–5.11)
RDW: 13.5 % (ref 11.5–15.5)
RDW: 13.8 % (ref 11.5–15.5)
WBC: 21.4 10*3/uL — AB (ref 4.0–10.5)
WBC: 27.7 10*3/uL — ABNORMAL HIGH (ref 4.0–10.5)

## 2017-05-21 LAB — RPR: RPR Ser Ql: NONREACTIVE

## 2017-05-21 MED ORDER — PRENATAL MULTIVITAMIN CH
1.0000 | ORAL_TABLET | Freq: Every day | ORAL | Status: DC
  Administered 2017-05-21 – 2017-05-22 (×2): 1 via ORAL
  Filled 2017-05-21 (×2): qty 1

## 2017-05-21 MED ORDER — FAMOTIDINE 20 MG PO TABS
20.0000 mg | ORAL_TABLET | Freq: Two times a day (BID) | ORAL | Status: DC
  Administered 2017-05-21 – 2017-05-22 (×3): 20 mg via ORAL
  Filled 2017-05-21 (×3): qty 1

## 2017-05-21 MED ORDER — DIPHENHYDRAMINE HCL 25 MG PO CAPS: 25.0000 mg | ORAL_CAPSULE | Freq: Four times a day (QID) | ORAL | Status: DC | PRN

## 2017-05-21 MED ORDER — SIMETHICONE 80 MG PO CHEW: 80.0000 mg | CHEWABLE_TABLET | ORAL | Status: DC | PRN

## 2017-05-21 MED ORDER — BENZOCAINE-MENTHOL 20-0.5 % EX AERO: 1.0000 "application " | INHALATION_SPRAY | CUTANEOUS | Status: DC | PRN

## 2017-05-21 MED ORDER — ONDANSETRON HCL 4 MG/2ML IJ SOLN: 4.0000 mg | INTRAMUSCULAR | Status: DC | PRN

## 2017-05-21 MED ORDER — SENNOSIDES-DOCUSATE SODIUM 8.6-50 MG PO TABS
2.0000 | ORAL_TABLET | ORAL | Status: DC
  Administered 2017-05-21: 2 via ORAL
  Filled 2017-05-21: qty 2

## 2017-05-21 MED ORDER — OXYCODONE HCL 5 MG PO TABS: 5.0000 mg | ORAL_TABLET | ORAL | Status: DC | PRN

## 2017-05-21 MED ORDER — OXYCODONE HCL 5 MG PO TABS: 10.0000 mg | ORAL_TABLET | ORAL | Status: DC | PRN

## 2017-05-21 MED ORDER — COCONUT OIL OIL: 1.0000 "application " | TOPICAL_OIL | Status: DC | PRN

## 2017-05-21 MED ORDER — ACETAMINOPHEN 325 MG PO TABS: 650.0000 mg | ORAL_TABLET | ORAL | Status: DC | PRN

## 2017-05-21 MED ORDER — TETANUS-DIPHTH-ACELL PERTUSSIS 5-2.5-18.5 LF-MCG/0.5 IM SUSP: 0.5000 mL | Freq: Once | INTRAMUSCULAR | Status: DC

## 2017-05-21 MED ORDER — DIBUCAINE 1 % RE OINT: 1.0000 "application " | TOPICAL_OINTMENT | RECTAL | Status: DC | PRN

## 2017-05-21 MED ORDER — ONDANSETRON HCL 4 MG PO TABS: 4.0000 mg | ORAL_TABLET | ORAL | Status: DC | PRN

## 2017-05-21 MED ORDER — IBUPROFEN 600 MG PO TABS
600.0000 mg | ORAL_TABLET | Freq: Four times a day (QID) | ORAL | Status: DC
  Administered 2017-05-21 – 2017-05-22 (×7): 600 mg via ORAL
  Filled 2017-05-21 (×7): qty 1

## 2017-05-21 MED ORDER — WITCH HAZEL-GLYCERIN EX PADS: 1.0000 "application " | MEDICATED_PAD | CUTANEOUS | Status: DC | PRN

## 2017-05-21 NOTE — Anesthesia Postprocedure Evaluation (Signed)
Anesthesia Post Note  Patient: [name]  Procedure(s) Performed: AN AD HOC LABOR EPIDURAL     Patient location during evaluation: Mother Baby Anesthesia Type: Epidural Level of consciousness: awake and alert Pain management: pain level controlled Vital Signs Assessment: post-procedure vital signs reviewed and stable Respiratory status: spontaneous breathing, nonlabored ventilation and respiratory function stable Cardiovascular status: stable Postop Assessment: no headache, no backache, epidural receding, no apparent nausea or vomiting, patient able to bend at knees and adequate PO intake Anesthetic complications: no    Last Vitals:  Vitals:   05/21/17 0124 05/21/17 0500  BP: (!) 123/53 104/76  Pulse: 71 70  Resp: 16 16  Temp: 36.7 C 36.8 C  SpO2: 99% 99%    Last Pain:  Vitals:   05/21/17 0550  TempSrc:   PainSc: 1    Pain Goal: Patients Stated Pain Goal: 5 (05/20/17 1232)               Laban EmperorMalinova,Kimmi Acocella Hristova

## 2017-05-21 NOTE — Progress Notes (Signed)
Late entry from morning rounds. Patient is eating, ambulating, voiding.  Pain control is good.  Appropriate lochia.  Vitals:   05/21/17 0124 05/21/17 0500 05/21/17 1556 05/21/17 1811  BP: (!) 123/53 104/76 125/64 (!) 114/58  Pulse: 71 70 71 72  Resp: 16 16 18 18   Temp: 98 F (36.7 C) 98.2 F (36.8 C) 98 F (36.7 C) 98.3 F (36.8 C)  TempSrc: Oral Axillary Axillary Oral  SpO2: 99% 99%  98%  Weight:      Height:        Fundus firm Ext: no calf tenderness  Lab Results  Component Value Date   WBC 21.4 (H) 05/21/2017   HGB 11.7 (L) 05/21/2017   HCT 33.8 (L) 05/21/2017   MCV 87.8 05/21/2017   PLT 298 05/21/2017    --/--/A POS, A POS Performed at [facility], [address] [place], [place] [address]  (02/18 [id number])  A/P Post partum day 1. Doing well  Routine care.  Expect d/c 2/20.    Philip AspenALLAHAN, Sya Nestler

## 2017-05-21 NOTE — Lactation Note (Addendum)
This note was copied from a baby's chart. Lactation Consultation Note  Patient Name: Melissa Young's Date: 05/21/2017 Reason for consult: Initial assessment;1st time breastfeeding;Primapara;Late-preterm 34-36.6wks;Infant < 6lbs;Maternal endocrine disorder Type of Endocrine Disorder?: PCOS  Visited with Melissa Young of 3539w4d baby <6 lbs, and 12 hrs old.  Baby has continued to be sleepy, showing no cues to feed.  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  Encouraged Young to continue this as long as she can.  Blood sugars ok so far.  Breast massage and hand expression reviewed and encouraged often.  No colostrum expressed currently. Young has pumped twice so far with DEBP at bedside.   Baby showing no effort or interest to latch.  Baby has had 2 supplements with Neosure 22 cal of 7 and 6 ml by curved tip syringe.  Nurse said that baby had a hard time with this method due to not forming a seal with her mouth, and not sucking even when upper palate was stimulated.  Demonstrated to parents how to pace bottle feed baby.  It took time and patience, but baby was able to attain suction and gentle, weak sucks and swallows.  Baby slowly took 5 ml Neosure with a burp half way through.  Showed parents how to offer baby chin support during feeding.  Baby placed STS on Young's chest again.  LPTI policy hand out reviewed with parents.  Plan- 1- Keep baby STS as much as possible. 2- every 3 hrs, or sooner if baby cues to feed sooner, attempt to latch baby to breast, call for help prn. 3- offer baby supplement by pace bottle feeding, 5-10 ml first 24 hrs increasing per volume guidelines. 4- pump both breasts on initiation setting for 15 mins, use breast massage and hand expression often to support her milk supply and offer baby drops of colostrum as able to.  Encouraged Young to double pump and supplement even if baby wakes and latched due to LPTI and her  risk factors of decreased milk supply.  Maternal Data Formula Feeding for Exclusion: No Has patient been taught Hand Expression?: Yes Does the patient have breastfeeding experience prior to this delivery?: No  Feeding Feeding Type: Formula Nipple Type: Slow - flow Length of feed: 0 min(no rooting)  LATCH Score Latch: Too sleepy or reluctant, no latch achieved, no sucking elicited.  Audible Swallowing: None  Type of Nipple: (short shafted nipples)  Comfort (Breast/Nipple): Soft / non-tender  Hold (Positioning): Full assist, staff holds infant at breast  LATCH Score: 4  Interventions Interventions: Breast feeding basics reviewed;Assisted with latch;Skin to skin;Breast massage;Hand express;Pre-pump if needed;Adjust position;Breast compression;Support pillows;Position options;Expressed milk;DEBP  Lactation Tools Discussed/Used Tools: Pump;Bottle Breast pump type: Double-Electric Breast Pump WIC Program: No Pump Review: Setup, frequency, and cleaning;Milk Storage Initiated by:: Erby Pian Daymond Cordts RN IBCLC Date initiated:: 05/21/17   Consult Status Consult Status: Follow-up Date: 05/22/17 Follow-up type: In-patient    Melissa Young, Melissa Young 05/21/2017, 10:57 AM

## 2017-05-22 MED ORDER — MEASLES, MUMPS & RUBELLA VAC ~~LOC~~ INJ
0.5000 mL | INJECTION | Freq: Once | SUBCUTANEOUS | Status: DC
  Filled 2017-05-22: qty 0.5

## 2017-05-22 NOTE — Discharge Summary (Signed)
Obstetric Discharge Summary Reason for Admission: rupture of membranes Prenatal Procedures: none Intrapartum Procedures: spontaneous vaginal delivery Postpartum Procedures: Rubella Ig Complications-Operative and Postpartum: 1 degree perineal laceration Hemoglobin  Date Value Ref Range Status  05/21/2017 11.7 (L) 12.0 - 15.0 g/dL Final   HCT  Date Value Ref Range Status  05/21/2017 33.8 (L) 36.0 - 46.0 % Final    Physical Exam:  General: alert, cooperative and appears stated age 69Lochia: appropriate Uterine Fundus: firm DVT Evaluation: No evidence of DVT seen on physical exam.  Discharge Diagnoses: PPROm at 36.4, delivered  Discharge Information: Date: 05/22/2017 Activity: pelvic rest Diet: routine Medications: PNV and Ibuprofen Condition: stable Instructions: refer to practice specific booklet Discharge to: home Follow-up Information    Marlow Baarslark, Nakota Elsen, MD Follow up on 05/27/2017.   Specialty:  Obstetrics Why:  BP check Contact information: [address] [place] [address] [phone]           Newborn Data: Live born female  Birth Weight: 5 lb 8.4 oz (2505 g) APGAR: 8, 9  Newborn Delivery   Birth date/time:  05/20/2017 22:14:00 Delivery type:  Vaginal, Spontaneous     Home with mother.  [facility] GEFFEL [place] Hankin 05/22/2017, 9:46 AM

## 2017-05-22 NOTE — Lactation Note (Signed)
This note was copied from a baby's chart. Lactation Consultation Note  Patient Name: Melissa Young RUEAV'WToday's Date: 05/22/2017  Parents doing a lot of skin to skin.  Baby nuzzles at breast but not latching.  Reassured mom that it is important now to pump and feed baby.  Baby is 2439 hours old and taking 5-13 mls every 3 hours of 22 calorie formula.  Instructed to try to give 10-20 mls every 3 hours.  Assisted mom with pumping.  She is obtaining drops.  Encouraged to call with concerns.   Maternal Data    Feeding    LATCH Score                   Interventions    Lactation Tools Discussed/Used     Consult Status      Huston FoleyMOULDEN, Teila Skalsky S 05/22/2017, 1:26 PM

## 2017-05-22 NOTE — Progress Notes (Signed)
Patient is doing well.  She is ambulating, voiding, tolerating PO.  Pain control is good.  Lochia is appropriate  Vitals:   05/21/17 0500 05/21/17 1556 05/21/17 1811 05/22/17 0616  BP: 104/76 125/64 (!) 114/58 118/73  Pulse: 70 71 72 79  Resp: 16 18 18 18   Temp: 98.2 F (36.8 C) 98 F (36.7 C) 98.3 F (36.8 C) 98 F (36.7 C)  TempSrc: Axillary Axillary Oral Oral  SpO2: 99%  98% 99%  Weight:      Height:        NAD Fundus firm Ext: symmetric, no calf tenderness  Lab Results  Component Value Date   WBC 21.4 (H) 05/21/2017   HGB 11.7 (L) 05/21/2017   HCT 33.8 (L) 05/21/2017   MCV 87.8 05/21/2017   PLT 298 05/21/2017    --/--/A POS, A POS Performed at [facility], [address], [place], [place] [address]  (02/18 0926)/RNI  A/P 32 y.o. G1P0101 PPD#2 s/p SVD at 36.4 for PPROM. GHTN---bps mild range since delivery--f/u w me on Monday for BP check RNI--mmr prior to d/c Meeting all goals--d/c today--expect will room in as baby likely not ready for d/c  [place]

## 2017-05-23 ENCOUNTER — Ambulatory Visit: Payer: Self-pay

## 2017-05-23 ENCOUNTER — Inpatient Hospital Stay (HOSPITAL_COMMUNITY): Payer: BC Managed Care – PPO

## 2017-05-23 NOTE — Lactation Note (Signed)
This note was copied from a baby'Young chart. Lactation Consultation Note  Patient Name: Melissa Boyd Kerbsshley Moorman WUJWJ'XToday'Young Date: 05/23/2017  Pecola LeisureBaby is progressing with the bottle feeds.  Mom pumping every 2-3 hours and obtaining 30 mls.  Discussed follow up support and encouraged to call prn.   Maternal Data    Feeding Feeding Type: Breast Milk with Formula added Nipple Type: Slow - flow  LATCH Score                   Interventions    Lactation Tools Discussed/Used     Consult Status      Melissa Young 05/23/2017, 9:22 AM

## 2017-12-23 DIAGNOSIS — Z30011 Encounter for initial prescription of contraceptive pills: Secondary | ICD-10-CM | POA: Diagnosis not present

## 2017-12-25 DIAGNOSIS — Z23 Encounter for immunization: Secondary | ICD-10-CM | POA: Diagnosis not present

## 2018-01-20 ENCOUNTER — Ambulatory Visit: Payer: BC Managed Care – PPO | Admitting: Family Medicine

## 2018-01-20 ENCOUNTER — Encounter: Payer: Self-pay | Admitting: Family Medicine

## 2018-01-20 VITALS — BP 130/80 | HR 78 | Temp 98.3°F | Resp 16 | Ht 65.0 in | Wt 300.0 lb

## 2018-01-20 DIAGNOSIS — G43701 Chronic migraine without aura, not intractable, with status migrainosus: Secondary | ICD-10-CM
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
## 2019-03-31 IMAGING — US US MFM FETAL BPP W/O NON-STRESS
1 series · 12 of 21 positions shown · non-contrast
Comparison: none

[Series 1: us mfm fetal bpp w/o non-stress · 21 acquisitions, 12 frames shown]
[im 1/21]
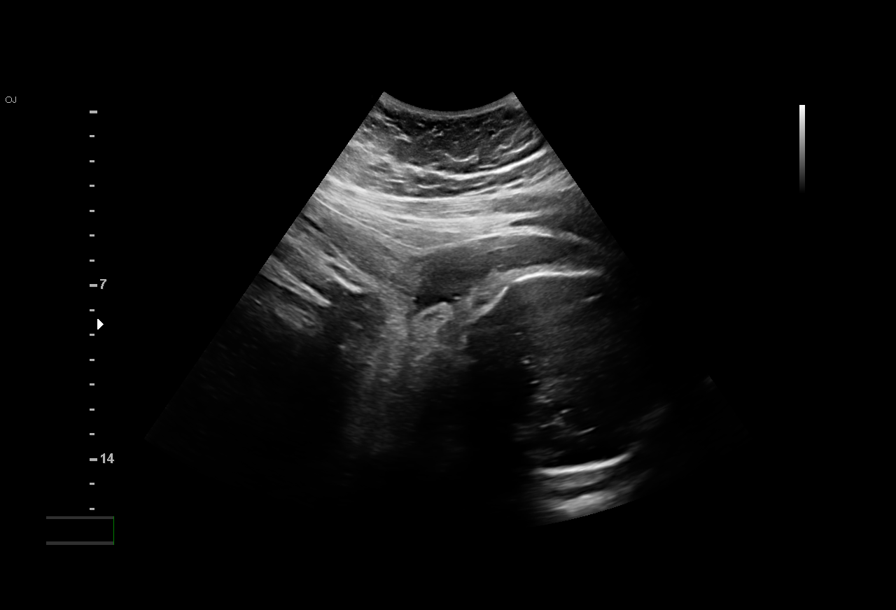
[im 3/21]
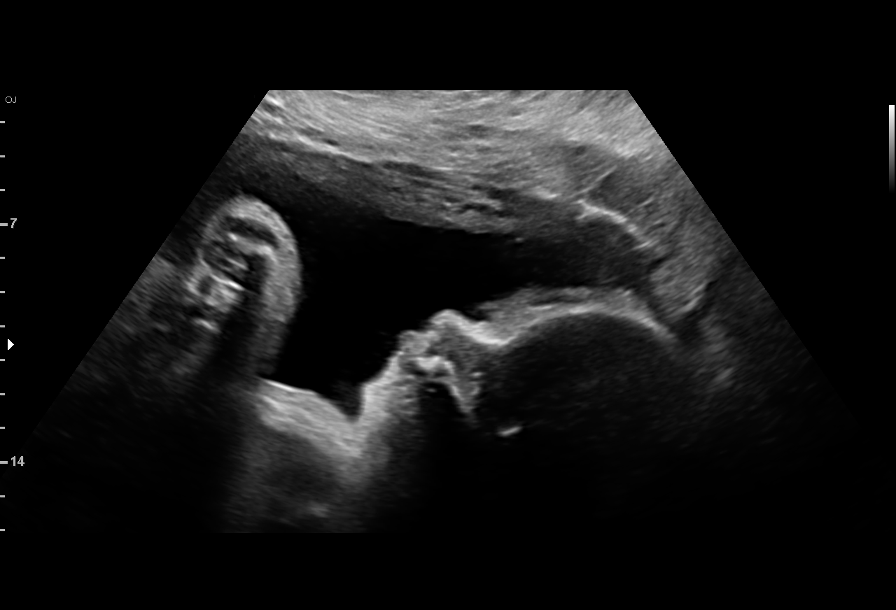
[im 5/21]
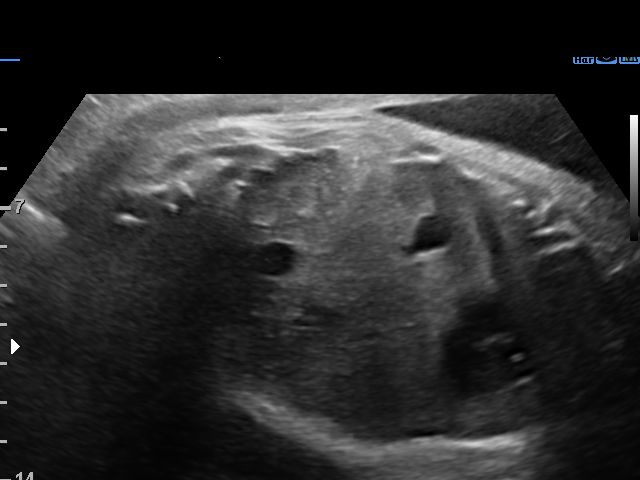
[im 7/21]
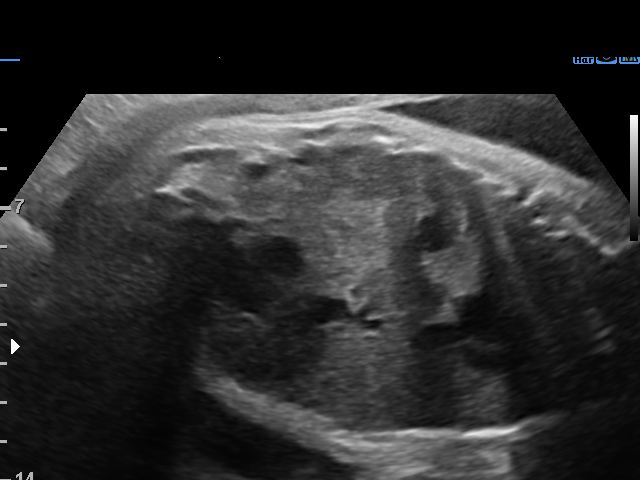
[im 8/21]
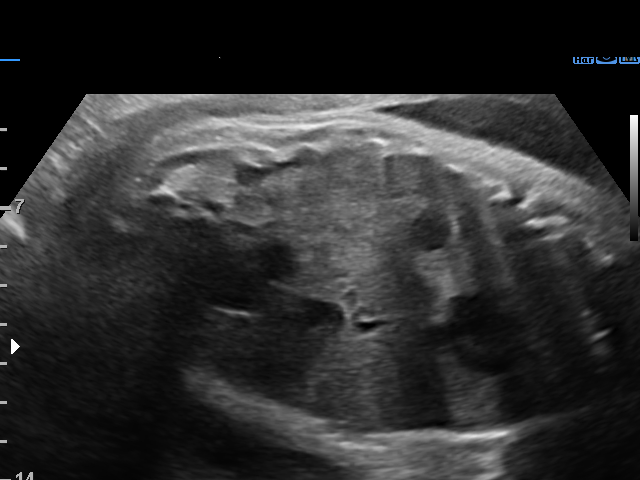
[im 10/21]
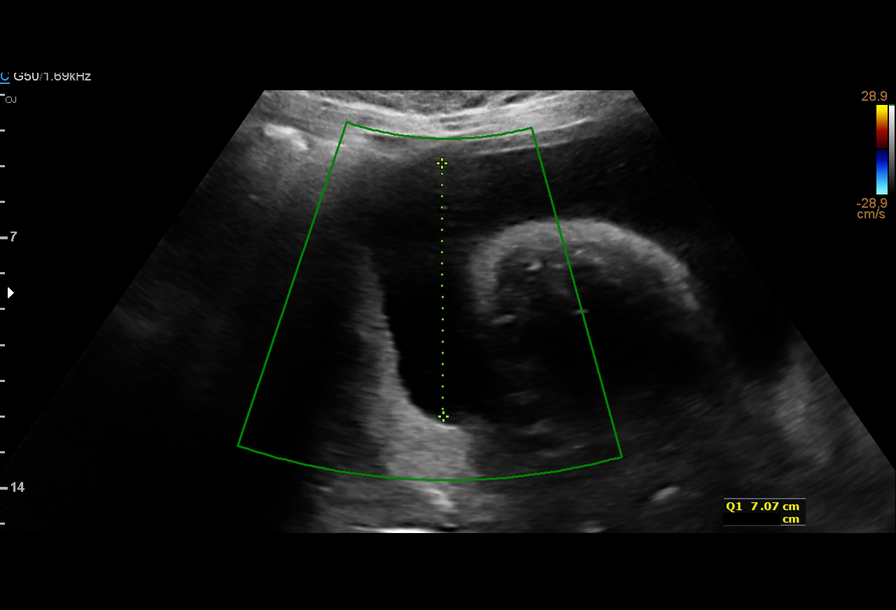
[im 12/21]
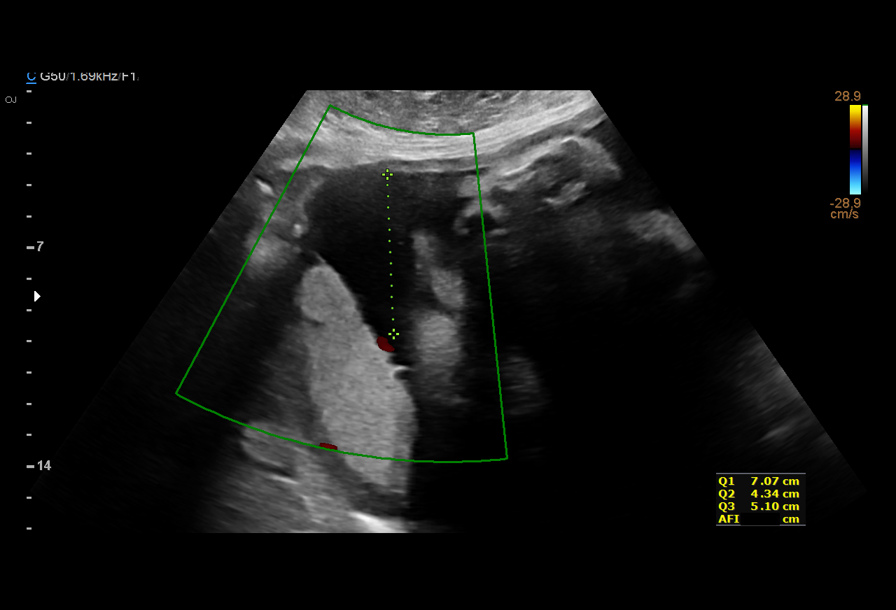
[im 14/21]
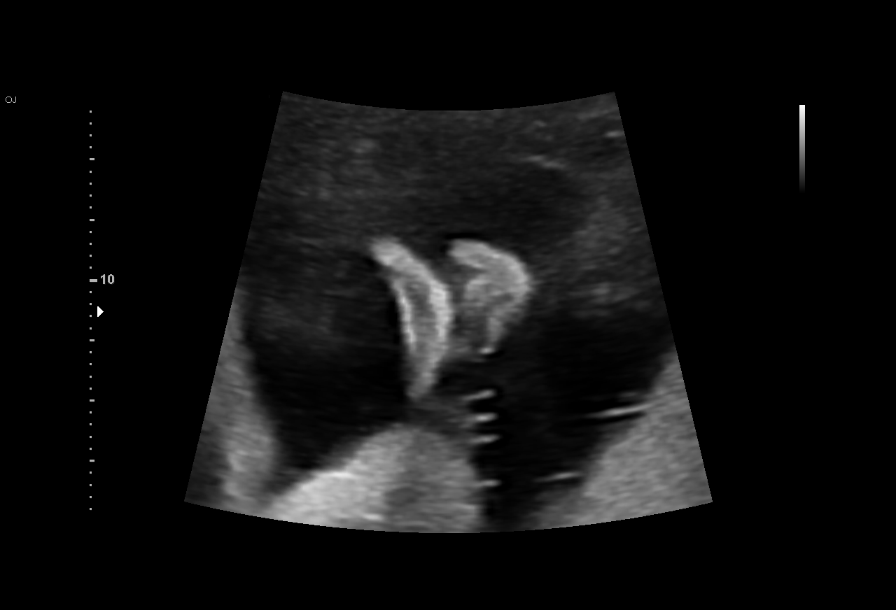
[im 15/21]
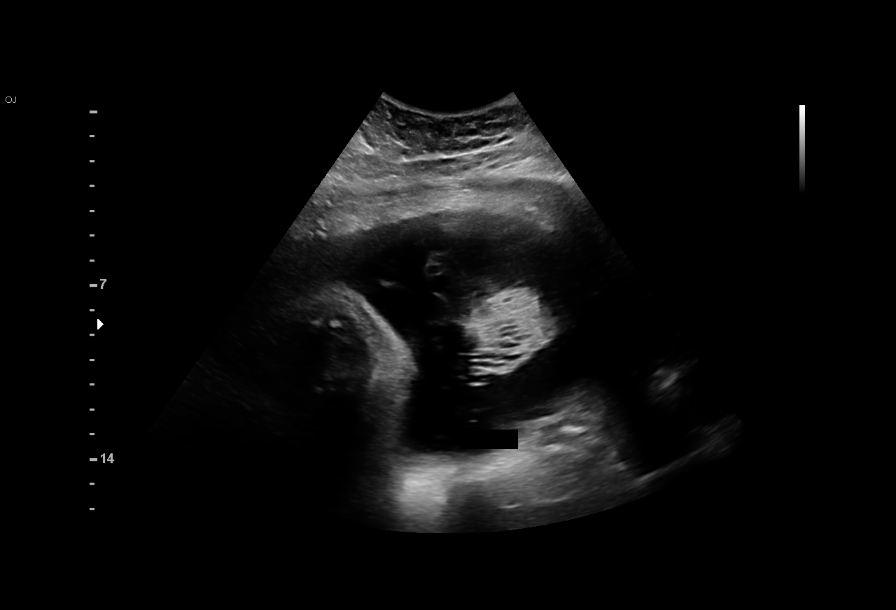
[im 17/21]
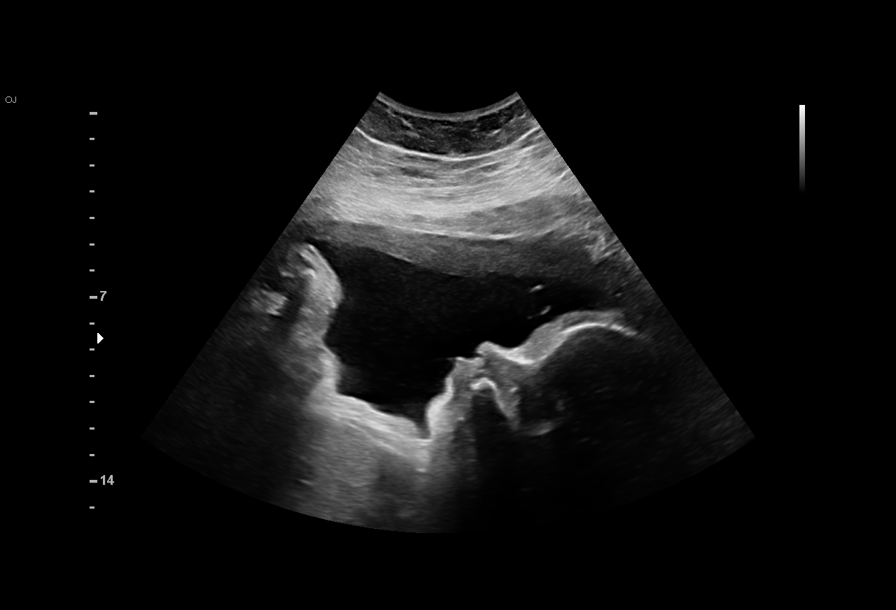
[im 19/21]
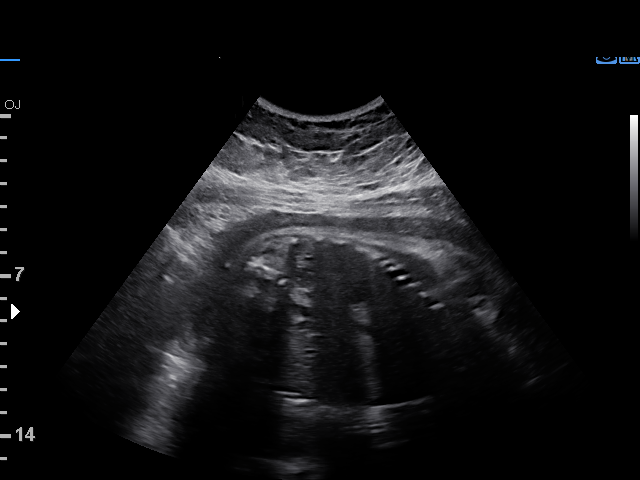
[im 21/21]
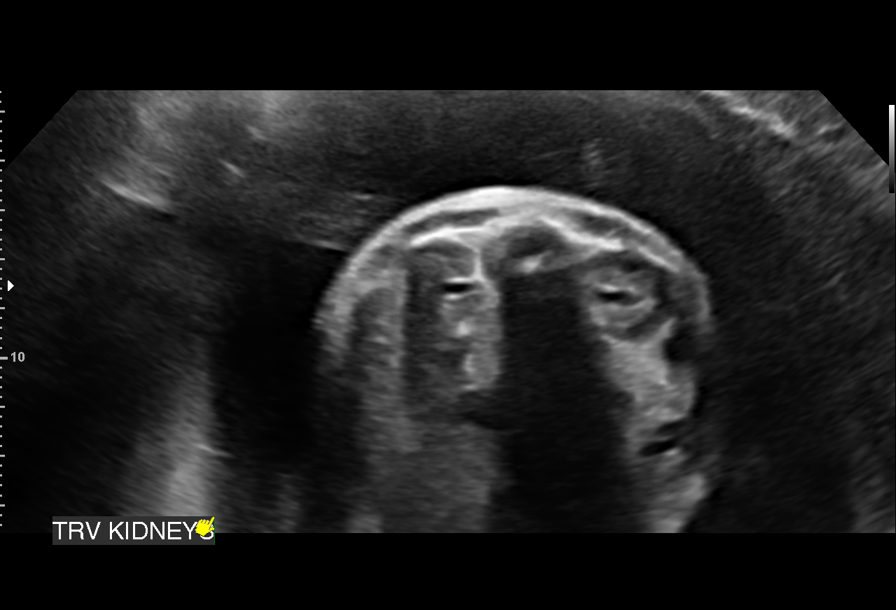

[12 of 21 positions shown; findings below may reference images not displayed]

MAU/Triage

1  JOSEPH K JNR GH            [id number]      [id number]     [id number]
Indications

32 weeks gestation of pregnancy
Non-reactive NST
OB History

Gravidity:     1         Term:  0          Prem:  0        SAB:   0
TOP:           0       Ectopic: 0         Living: 0
Fetal Evaluation

Num Of Fetuses:      1
Fetal Heart          153
Rate(bpm):
Cardiac Activity:    Observed
Presentation:        Cephalic

Amniotic Fluid
AFI FV:      Subjectively within normal limits

AFI Sum(cm)     %Tile       Largest Pocket(cm)
20.18           76

RUQ(cm)       RLQ(cm)        LUQ(cm)        LLQ(cm)
7.07
Biophysical Evaluation

Amniotic F.V:   Within normal limits        F. Tone:         Observed
F. Movement:    Not Observed                Score:           [DATE]
F. Breathing:   Observed
Gestational Age
Clinical EDD:   32w 4d                                         EDD:  06/13/17
Best:           32w 4d    Det. By:  Clinical EDD               EDD:  06/13/17
Impression

Single living intrauterine pregnancy at 32w 4d.
Cephalic presentation.
Normal amniotic fluid volume.
BPP [DATE].
Recommendations

Continue clinical evaluation and management.
